# Patient Record
Sex: Male | Born: 1990 | Race: White | Hispanic: No | Marital: Single | Smoking: Former smoker
Health system: Southern US, Community
[De-identification: ages and names within clinical notes are randomized; demographics above are authoritative.]

## PROBLEM LIST (undated history)

## (undated) DIAGNOSIS — I499 Cardiac arrhythmia, unspecified: Secondary | ICD-10-CM

## (undated) DIAGNOSIS — F419 Anxiety disorder, unspecified: Secondary | ICD-10-CM

## (undated) HISTORY — DX: Cardiac arrhythmia, unspecified: I49.9

## (undated) HISTORY — DX: Anxiety disorder, unspecified: F41.9

## (undated) HISTORY — PX: OTHER SURGICAL HISTORY: SHX169

---
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
## 2013-06-11 ENCOUNTER — Emergency Department (HOSPITAL_COMMUNITY)
Admission: EM | Admit: 2013-06-11 | Discharge: 2013-06-12 | Disposition: A | Discharge status: Home / Self Care | Payer: Self-pay | Attending: Emergency Medicine | Admitting: Emergency Medicine

## 2013-06-11 ENCOUNTER — Encounter (HOSPITAL_COMMUNITY): Payer: Self-pay | Admitting: Emergency Medicine

## 2013-06-11 DIAGNOSIS — R55 Syncope and collapse: Secondary | ICD-10-CM | POA: Insufficient documentation

## 2013-06-11 DIAGNOSIS — R1012 Left upper quadrant pain: Secondary | ICD-10-CM | POA: Insufficient documentation

## 2013-06-11 DIAGNOSIS — F17200 Nicotine dependence, unspecified, uncomplicated: Secondary | ICD-10-CM | POA: Insufficient documentation

## 2013-06-11 DIAGNOSIS — R1011 Right upper quadrant pain: Secondary | ICD-10-CM | POA: Insufficient documentation

## 2013-06-11 DIAGNOSIS — R1013 Epigastric pain: Secondary | ICD-10-CM | POA: Insufficient documentation

## 2013-06-11 DIAGNOSIS — R300 Dysuria: Secondary | ICD-10-CM | POA: Insufficient documentation

## 2013-06-11 DIAGNOSIS — B349 Viral infection, unspecified: Secondary | ICD-10-CM

## 2013-06-11 DIAGNOSIS — B9789 Other viral agents as the cause of diseases classified elsewhere: Secondary | ICD-10-CM | POA: Insufficient documentation

## 2013-06-11 NOTE — ED Notes (Signed)
Pt c/o fever, body aches, chills, headache, emesis x 3 last 24 hours

## 2013-06-12 ENCOUNTER — Emergency Department (HOSPITAL_COMMUNITY): Payer: Medicaid Other

## 2013-06-12 LAB — URINALYSIS, ROUTINE W REFLEX MICROSCOPIC
Hgb urine dipstick: NEGATIVE
Protein, ur: NEGATIVE mg/dL
Urobilinogen, UA: 1 mg/dL (ref 0.0–1.0)

## 2013-06-12 MED ORDER — SODIUM CHLORIDE 0.9 % IV BOLUS (SEPSIS)
1000.0000 mL | Freq: Once | INTRAVENOUS | Status: AC
  Administered 2013-06-12: 1000 mL via INTRAVENOUS

## 2013-06-12 MED ORDER — ONDANSETRON HCL 4 MG PO TABS: 4.0000 mg | ORAL_TABLET | Freq: Four times a day (QID) | ORAL | Status: DC

## 2013-06-12 MED ORDER — ACETAMINOPHEN 325 MG PO TABS
650.0000 mg | ORAL_TABLET | Freq: Once | ORAL | Status: AC
  Administered 2013-06-12: 650 mg via ORAL
  Filled 2013-06-12: qty 2

## 2013-06-12 MED ORDER — ONDANSETRON HCL 4 MG/2ML IJ SOLN
4.0000 mg | Freq: Once | INTRAMUSCULAR | Status: AC
  Administered 2013-06-12: 4 mg via INTRAVENOUS
  Filled 2013-06-12: qty 2

## 2013-06-12 NOTE — ED Notes (Signed)
Pt states he has taken advil for his symptoms.  Pt states he took 3 advil yesterday and then he took another 3 advil today around 1300 hrs.

## 2013-06-12 NOTE — ED Provider Notes (Signed)
CSN: [id number]     Arrival date & time 06/11/13  2345 History   First MD Initiated Contact with Patient 06/12/13 0005     Chief Complaint  Patient presents with  . Fever  . Emesis   (Consider location/radiation/quality/duration/timing/severity/associated sxs/prior Treatment) Patient is a 22 y.o. male presenting with fever and syncope. The history is provided by the patient. No language interpreter was used.  Fever Max temp prior to arrival:  103.1 Temp source:  Oral Severity:  Moderate Onset quality:  Gradual Duration:  2 days Timing:  Constant Progression:  Unchanged Chronicity:  New Relieved by:  Nothing Worsened by:  Nothing tried Ineffective treatments:  None tried Associated symptoms: chills, dysuria, headaches, myalgias, nausea and vomiting   Associated symptoms: no chest pain, no confusion, no congestion, no cough, no diarrhea, no ear pain, no rash, no rhinorrhea and no sore throat   Dysuria:    Severity:  Mild   Onset quality:  Gradual   Duration:  2 days   Timing:  Intermittent   Progression:  Waxing and waning   Chronicity:  New Headaches:    Severity:  Mild   Onset quality:  Gradual   Duration:  1 day   Timing:  Intermittent   Progression:  Partially resolved   Chronicity:  New Risk factors: no hx of cancer, no immunosuppression, no occupational exposure, no recent surgery, no recent travel and no sick contacts   Loss of Consciousness Episode history:  Single Most recent episode:  Yesterday Timing:  Rare Progression:  Resolved Chronicity:  New Context: standing up (showering)   Witnessed: no   Relieved by:  Lying down Worsened by:  Nothing tried Associated symptoms: fever, headaches, nausea and vomiting   Associated symptoms: no chest pain, no confusion, no dizziness, no shortness of breath and no weakness   Associated symptoms comment:  Anorexia Risk factors: no congenital heart disease, no coronary artery disease, no seizures and no vascular disease      History reviewed. No pertinent past medical history. History reviewed. No pertinent past surgical history. No family history on file. History  Substance Use Topics  . Smoking status: Current Every Day Smoker  . Smokeless tobacco: Not on file  . Alcohol Use: No    Review of Systems  Constitutional: Positive for fever and chills. Negative for activity change, appetite change and fatigue.  HENT: Negative for ear pain, congestion, sore throat, facial swelling, rhinorrhea and trouble swallowing.   Eyes: Negative for photophobia and pain.  Respiratory: Negative for cough, chest tightness and shortness of breath.   Cardiovascular: Positive for syncope. Negative for chest pain and leg swelling.  Gastrointestinal: Positive for nausea and vomiting. Negative for abdominal pain, diarrhea and constipation.  Endocrine: Negative for polydipsia and polyuria.  Genitourinary: Positive for dysuria. Negative for urgency, decreased urine volume and difficulty urinating.  Musculoskeletal: Positive for myalgias. Negative for back pain and gait problem.  Skin: Negative for color change, rash and wound.  Allergic/Immunologic: Negative for immunocompromised state.  Neurological: Positive for headaches. Negative for dizziness, facial asymmetry, speech difficulty, weakness and numbness.  Psychiatric/Behavioral: Negative for confusion, decreased concentration and agitation.    Allergies  Review of patient's allergies indicates no known allergies.  Home Medications   Current Outpatient Rx  Name  Route  Sig  Dispense  Refill  . acetaminophen (TYLENOL) 500 MG tablet   Oral   Take 1,000 mg by mouth every 6 (six) hours as needed for pain.         Marland Kitchen  ondansetron (ZOFRAN) 4 MG tablet   Oral   Take 1 tablet (4 mg total) by mouth every 6 (six) hours.   15 tablet   0    BP 101/66  Pulse 135  Temp(Src) 99.9 F (37.7 C) (Oral)  Resp 20  Ht 6\' 1"  (1.854 m)  Wt 230 lb (104.327 kg)  BMI 30.35 kg/m2   SpO2 100% Physical Exam  Constitutional: He is oriented to person, place, and time. He appears well-developed and well-nourished. No distress.  HENT:  Head: Normocephalic and atraumatic.  Mouth/Throat: No oropharyngeal exudate.  Eyes: Pupils are equal, round, and reactive to light.  Neck: Normal range of motion. Neck supple.  Cardiovascular: Regular rhythm and normal heart sounds.  Tachycardia present.  Exam reveals no gallop and no friction rub.   No murmur heard. Pulmonary/Chest: Effort normal and breath sounds normal. No respiratory distress. He has no wheezes. He has no rales.  Abdominal: Soft. Bowel sounds are normal. He exhibits no distension and no mass. There is tenderness (mild) in the right upper quadrant, epigastric area and left upper quadrant. There is no rigidity, no rebound and no guarding.  Musculoskeletal: Normal range of motion. He exhibits no edema and no tenderness.  Neurological: He is alert and oriented to person, place, and time.  Skin: Skin is warm and dry.  Psychiatric: He has a normal mood and affect.    ED Course  Procedures (including critical care time) Labs Review Labs Reviewed  URINALYSIS, ROUTINE W REFLEX MICROSCOPIC - Abnormal; Notable for the following:    Color, Urine AMBER (*)    Specific Gravity, Urine 1.031 (*)    Bilirubin Urine SMALL (*)    All other components within normal limits   Imaging Review Dg Chest 2 View  06/12/2013   *RADIOLOGY REPORT*  Clinical Data: Fever, body aches, chills, headache, and emesis.  CHEST - 2 VIEW  Comparison: None.  Findings: The heart size and pulmonary vascularity are normal. The lungs appear clear and expanded without focal air space disease or consolidation. No blunting of the costophrenic angles.  No pneumothorax.  Mediastinal contours appear intact.  IMPRESSION: No evidence of active pulmonary disease.   Original Report Authenticated By: Burman Nieves, M.D.     Date: 06/12/2013  Rate: 100  Rhythm: sinus  tachycardia  QRS Axis: normal  Intervals: normal  ST/T Wave abnormalities: nonspecific T wave changes, TWI III, aVF, V1, V3, TW flattening V4-V6  Conduction Disutrbances:none  Narrative Interpretation:   Old EKG Reviewed: none available    MDM   1. Acute viral syndrome    Pt is a 22 y.o. male with Pmhx as above who presents with 2 days of fever, chills, myalgias, n/v, and one episode of syncope yesterday proceeded by lightheadedness & visual change.  On exam, pt is tachycardic, febrile, but non-toxic appearing.  Lungs clear, mild upper abdominal ttp w/o rebound or guarding.  No neck stiffness or rashes. EKG as above, CXR unremarkable. Symptoms improved after 1L NS, zofran, tylenol.  I believe he has acute viral syndrome and is safe for d/c with supportive care (antipyretics, PO hydration).  Return precautions given for new or worsening symptoms including CP, SOB, inability to tolerate PO.    1. Acute viral syndrome         Shanna Cisco, MD 06/12/13 [phone]

## 2015-10-08 DIAGNOSIS — I499 Cardiac arrhythmia, unspecified: Secondary | ICD-10-CM

## 2015-10-08 DIAGNOSIS — F419 Anxiety disorder, unspecified: Secondary | ICD-10-CM

## 2015-10-08 HISTORY — DX: Cardiac arrhythmia, unspecified: I49.9

## 2015-10-08 HISTORY — DX: Anxiety disorder, unspecified: F41.9

## 2016-02-05 ENCOUNTER — Encounter (HOSPITAL_COMMUNITY): Payer: Self-pay | Admitting: Emergency Medicine

## 2016-02-05 ENCOUNTER — Emergency Department (HOSPITAL_COMMUNITY)
Admission: EM | Admit: 2016-02-05 | Discharge: 2016-02-05 | Disposition: A | Discharge status: Home / Self Care | Payer: Medicaid Other | Attending: Emergency Medicine | Admitting: Emergency Medicine

## 2016-02-05 DIAGNOSIS — M546 Pain in thoracic spine: Secondary | ICD-10-CM | POA: Diagnosis present

## 2016-02-05 DIAGNOSIS — F17210 Nicotine dependence, cigarettes, uncomplicated: Secondary | ICD-10-CM | POA: Insufficient documentation

## 2016-02-05 MED ORDER — TRAMADOL HCL 50 MG PO TABS: 50.0000 mg | ORAL_TABLET | Freq: Two times a day (BID) | ORAL | Status: DC | PRN

## 2016-02-05 MED ORDER — IBUPROFEN 800 MG PO TABS: 800.0000 mg | ORAL_TABLET | Freq: Three times a day (TID) | ORAL | Status: DC

## 2016-02-05 MED ORDER — CYCLOBENZAPRINE HCL 10 MG PO TABS
10.0000 mg | ORAL_TABLET | Freq: Once | ORAL | Status: AC
  Administered 2016-02-05: 10 mg via ORAL
  Filled 2016-02-05: qty 1

## 2016-02-05 MED ORDER — CYCLOBENZAPRINE HCL 10 MG PO TABS: 10.0000 mg | ORAL_TABLET | Freq: Two times a day (BID) | ORAL | Status: DC | PRN

## 2016-02-05 MED ORDER — DEXAMETHASONE SODIUM PHOSPHATE 10 MG/ML IJ SOLN
10.0000 mg | Freq: Once | INTRAMUSCULAR | Status: AC
  Administered 2016-02-05: 10 mg via INTRAMUSCULAR
  Filled 2016-02-05: qty 1

## 2016-02-05 MED ORDER — KETOROLAC TROMETHAMINE 60 MG/2ML IM SOLN
60.0000 mg | Freq: Once | INTRAMUSCULAR | Status: AC
  Administered 2016-02-05: 60 mg via INTRAMUSCULAR
  Filled 2016-02-05: qty 2

## 2016-02-05 NOTE — Discharge Instructions (Signed)
Thoracic Strain A thoracic strain, which is sometimes called a mid-back strain, is an injury to the muscles or tendons that attach to the upper part of your back behind your chest. This type of injury occurs when a muscle is overstretched or overloaded.  Thoracic strains can range from mild to severe. Mild strains may involve stretching a muscle or tendon without tearing it. These injuries may heal in 1-2 weeks. More severe strains involve tearing of muscle fibers or tendons. These will cause more pain and may take 6-8 weeks to heal. CAUSES This condition may be caused by:  An injury in which a sudden force is placed on the muscle.  Exercising without properly warming up.  Overuse of the muscle.  Improper form during certain movements.  Other injuries that surround or cause stress on the mid-back, causing a strain on the muscles. In some cases, the cause may not be known. RISK FACTORS This injury is more common in:  Athletes.  People with obesity. SYMPTOMS The main symptom of this condition is pain, especially with movement. Other symptoms include:  Bruising.  Swelling.  Spasm. DIAGNOSIS This condition may be diagnosed with a physical exam. X-rays may be taken to check for a fracture. TREATMENT This condition may be treated with:  Resting and icing the injured area.  Physical therapy. This will involve doing stretching and strengthening exercises.  Medicines for pain and inflammation. HOME CARE INSTRUCTIONS  Rest as needed. Follow instructions from your health care provider about any restrictions on activity.  If directed, apply ice to the injured area:  Put ice in a plastic bag.  Place a towel between your skin and the bag.  Leave the ice on for 20 minutes, 2-3 times per day.  Take over-the-counter and prescription medicines only as told by your health care provider.  Begin doing exercises as told by your health care provider or physical therapist.  Always  warm up properly before physical activity or sports.  Bend your knees before you lift heavy objects.  Keep all follow-up visits as told by your health care provider. This is important. SEEK MEDICAL CARE IF:  Your pain is not helped by medicine.  Your pain, bruising, or swelling is getting worse.  You have a fever. SEEK IMMEDIATE MEDICAL CARE IF:  You have shortness of breath.  You have chest pain.  You develop numbness or weakness in your legs.  You have involuntary loss of urine (urinary incontinence).   This information is not intended to replace advice given to you by your health care provider. Make sure you discuss any questions you have with your health care provider.   Document Released: 12/14/2003 Document Revised: 06/14/2015 Document Reviewed: 11/17/2014 Elsevier Interactive Patient Education 2016 [employer] Inc[employer]  

## 2016-02-05 NOTE — ED Provider Notes (Signed)
CSN: [id number]     Arrival date & time 02/05/16  0033 History   First MD Initiated Contact with Patient 02/05/16 0056     Chief Complaint  Patient presents with  . Back Pain    right mid     (Consider location/radiation/quality/duration/timing/severity/associated sxs/prior Treatment) Patient is a 25 y.o. male presenting with back pain. The history is provided by the patient.  Back Pain Location:  Thoracic spine Radiates to:  Does not radiate Pain severity:  Severe Pain is:  Same all the time Onset quality:  Gradual Timing:  Constant Progression:  Worsening Chronicity:  New Context: physical stress   Context: not emotional stress, not falling, not jumping from heights, not lifting heavy objects, not MCA, not MVA, not occupational injury, not pedestrian accident, not recent illness, not recent injury and not twisting   Relieved by:  Nothing Worsened by:  Heat Ineffective treatments:  None tried Associated symptoms: no abdominal pain, no abdominal swelling, no bladder incontinence, no bowel incontinence, no chest pain, no dysuria, no fever, no headaches, no leg pain, no numbness, no paresthesias, no pelvic pain, no perianal numbness, no tingling, no weakness and no weight loss   Risk factors: no hx of cancer, not obese, no recent surgery, no steroid use and no vascular disease      Pt complains of new, 1 days of right mid to lower back pain, described as tight, sharp, non radiating, 8/10, worse with sitting straight up or bending over worse.  He applied heat, w/o relief, no other tx attempted.  No IVDU, no chronic steroids, no pmhx of back pain or injury, no numbness/tingling, no weakness, no urinary incontinence.   History reviewed. No pertinent past medical history. History reviewed. No pertinent past surgical history. History reviewed. No pertinent family history. Social History  Substance Use Topics  . Smoking status: Current Every Day Smoker -- 0.50 packs/day    Types:  Cigarettes  . Smokeless tobacco: None  . Alcohol Use: Yes     Comment: social    Review of Systems  Constitutional: Negative for fever and weight loss.  Cardiovascular: Negative for chest pain.  Gastrointestinal: Negative for abdominal pain and bowel incontinence.  Genitourinary: Negative for bladder incontinence, dysuria and pelvic pain.  Musculoskeletal: Positive for back pain.  Neurological: Negative for tingling, weakness, numbness, headaches and paresthesias.  All other systems reviewed and are negative.     Allergies  Review of patient's allergies indicates no known allergies.  Home Medications   Prior to Admission medications   Medication Sig Start Date End Date Taking? Authorizing Provider  acetaminophen (TYLENOL) 500 MG tablet Take 1,000 mg by mouth every 6 (six) hours as needed for pain.   Yes Historical Provider, MD  cyclobenzaprine (FLEXERIL) 10 MG tablet Take 1 tablet (10 mg total) by mouth 2 (two) times daily as needed for muscle spasms. 02/05/16   Danelle Berry, PA-C  ibuprofen (ADVIL,MOTRIN) 800 MG tablet Take 1 tablet (800 mg total) by mouth 3 (three) times daily. 02/05/16   Danelle Berry, PA-C  traMADol (ULTRAM) 50 MG tablet Take 1 tablet (50 mg total) by mouth every 12 (twelve) hours as needed for severe pain. 02/05/16   Danelle Berry, PA-C   BP 106/67 mmHg  Pulse 68  Temp(Src) 98.5 F (36.9 C) (Oral)  Resp 20  SpO2 99% Physical Exam  Constitutional: He is oriented to person, place, and time. He appears well-developed and well-nourished. No distress.  HENT:  Head: Normocephalic and atraumatic.  Right  Ear: External ear normal.  Left Ear: External ear normal.  Nose: Nose normal.  Mouth/Throat: Oropharynx is clear and moist. No oropharyngeal exudate.  Eyes: Conjunctivae and EOM are normal. Pupils are equal, round, and reactive to light. Right eye exhibits no discharge. Left eye exhibits no discharge. No scleral icterus.  Neck: Normal range of motion. Neck supple. No  JVD present. No tracheal deviation present.  Cardiovascular: Normal rate and regular rhythm.   Pulmonary/Chest: Effort normal and breath sounds normal. No stridor. No respiratory distress. He has no wheezes. He has no rales. He exhibits no tenderness.  Abdominal: Soft. Bowel sounds are normal.  Musculoskeletal: Normal range of motion. He exhibits no edema.  No midline tenderness along spinal processes from cervical to lumbar spine, mild thoracic right paraspinal tenderness, no spasm  Lymphadenopathy:    He has no cervical adenopathy.  Neurological: He is alert and oriented to person, place, and time. No cranial nerve deficit. He exhibits normal muscle tone. Coordination normal.  Normal sensation to light touch in all extremities 5/5 strength bilaterally with upper extremities grip strength, bilaterally with lower extremity dorsiflexion, plantarflexion, flexion and extension of bilateral knees  Skin: Skin is warm and dry. No rash noted. He is not diaphoretic. No erythema. No pallor.  Psychiatric: He has a normal mood and affect. His behavior is normal. Judgment and thought content normal.  Nursing note and vitals reviewed.   ED Course  Procedures (including critical care time) Labs Review Labs Reviewed - No data to display  Imaging Review No results found. I have personally reviewed and evaluated these images and lab results as part of my medical decision-making.   EKG Interpretation None      MDM   Patient with back pain s/p straining injury today with lifting gallons of paint.  No neurological deficits and normal neuro exam.  Patient can walk but states is painful.  No loss of bowel or bladder control.  No concern for cauda equina.  No fever, night sweats, weight loss, h/o cancer, IVDU.  RICE protocol and pain medicine indicated and discussed with patient.     Final diagnoses:  Right-sided thoracic back pain        Danelle BerryLeisa Harace Mccluney, PA-C 02/05/16 0200  Derwood KaplanAnkit Nanavati,  MD 02/05/16 [phone]

## 2016-02-05 NOTE — ED Notes (Signed)
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 Patient is ambulatory. Has not treated his pain PTA.

## 2016-02-19 ENCOUNTER — Emergency Department (HOSPITAL_COMMUNITY)
Admission: EM | Admit: 2016-02-19 | Discharge: 2016-02-19 | Disposition: A | Discharge status: Home / Self Care | Payer: Medicaid Other | Attending: Emergency Medicine | Admitting: Emergency Medicine

## 2016-02-19 ENCOUNTER — Encounter (HOSPITAL_COMMUNITY): Payer: Self-pay | Admitting: Emergency Medicine

## 2016-02-19 ENCOUNTER — Emergency Department (HOSPITAL_COMMUNITY): Payer: Medicaid Other

## 2016-02-19 DIAGNOSIS — Z79891 Long term (current) use of opiate analgesic: Secondary | ICD-10-CM | POA: Insufficient documentation

## 2016-02-19 DIAGNOSIS — Z791 Long term (current) use of non-steroidal anti-inflammatories (NSAID): Secondary | ICD-10-CM | POA: Insufficient documentation

## 2016-02-19 DIAGNOSIS — R0789 Other chest pain: Secondary | ICD-10-CM | POA: Insufficient documentation

## 2016-02-19 DIAGNOSIS — R079 Chest pain, unspecified: Secondary | ICD-10-CM | POA: Diagnosis present

## 2016-02-19 DIAGNOSIS — F17210 Nicotine dependence, cigarettes, uncomplicated: Secondary | ICD-10-CM | POA: Insufficient documentation

## 2016-02-19 DIAGNOSIS — Z79899 Other long term (current) drug therapy: Secondary | ICD-10-CM | POA: Diagnosis not present

## 2016-02-19 LAB — LIPASE, BLOOD: Lipase: 27 U/L (ref 11–51)

## 2016-02-19 LAB — CBC
HCT: 44.8 % (ref 39.0–52.0)
Hemoglobin: 15.5 g/dL (ref 13.0–17.0)
MCH: 30.6 pg (ref 26.0–34.0)
MCHC: 34.6 g/dL (ref 30.0–36.0)
MCV: 88.5 fL (ref 78.0–100.0)
PLATELETS: 225 10*3/uL (ref 150–400)
RBC: 5.06 MIL/uL (ref 4.22–5.81)
RDW: 12.5 % (ref 11.5–15.5)
WBC: 9.4 10*3/uL (ref 4.0–10.5)

## 2016-02-19 LAB — BASIC METABOLIC PANEL
Anion gap: 7 (ref 5–15)
BUN: 13 mg/dL (ref 6–20)
CHLORIDE: 105 mmol/L (ref 101–111)
CO2: 25 mmol/L (ref 22–32)
CREATININE: 0.81 mg/dL (ref 0.61–1.24)
Calcium: 8.9 mg/dL (ref 8.9–10.3)
GFR calc non Af Amer: >60 mL/min (ref >60)
GLUCOSE: 109 mg/dL — AB (ref 65–99)
Potassium: 3.8 mmol/L (ref 3.5–5.1)
Sodium: 137 mmol/L (ref 135–145)

## 2016-02-19 LAB — TROPONIN I: Troponin I: <0.03 ng/mL (ref <0.031)

## 2016-02-19 LAB — I-STAT TROPONIN, ED: TROPONIN I, POC: 0 ng/mL (ref 0.00–0.08)

## 2016-02-19 NOTE — Discharge Instructions (Signed)
There does not appear to be an emergent cause for your symptoms at this time. Your exam, labs, EKG and chest x-ray are all reassuring. Please follow-up with your doctor for reevaluation in the next 1-2 days. Stop smoking. Return to ED for new or worsening symptoms.  Nonspecific Chest Pain It is often hard to find the cause of chest pain. There is always a chance that your pain could be related to something serious, such as a heart attack or a blood clot in your lungs. Chest pain can also be caused by conditions that are not life-threatening. If you have chest pain, it is very important to follow up with your doctor.  HOME CARE  If you were prescribed an antibiotic medicine, finish it all even if you start to feel better.  Avoid any activities that cause chest pain.  Do not use any tobacco products, including cigarettes, chewing tobacco, or electronic cigarettes. If you need help quitting, ask your doctor.  Do not drink alcohol.  Take medicines only as told by your doctor.  Keep all follow-up visits as told by your doctor. This is important. This includes any further testing if your chest pain does not go away.  Your doctor may tell you to keep your head raised (elevated) while you sleep.  Make lifestyle changes as told by your doctor. These may include:  Getting regular exercise. Ask your doctor to suggest some activities that are safe for you.  Eating a heart-healthy diet. Your doctor or a diet specialist (dietitian) can help you to learn healthy eating options.  Maintaining a healthy weight.  Managing diabetes, if necessary.  Reducing stress. GET HELP IF:  Your chest pain does not go away, even after treatment.  You have a rash with blisters on your chest.  You have a fever. GET HELP RIGHT AWAY IF:  Your chest pain is worse.  You have an increasing cough, or you cough up blood.  You have severe belly (abdominal) pain.  You feel extremely weak.  You pass out  (faint).  You have chills.  You have sudden, unexplained chest discomfort.  You have sudden, unexplained discomfort in your arms, back, neck, or jaw.  You have shortness of breath at any time.  You suddenly start to sweat, or your skin gets clammy.  You feel nauseous.  You vomit.  You suddenly feel light-headed or dizzy.  Your heart begins to beat quickly, or it feels like it is skipping beats. These symptoms may be an emergency. Do not wait to see if the symptoms will go away. Get medical help right away. Call your local emergency services (911 in the [place] Do not drive yourself to the hospital.   This information is not intended to replace advice given to you by your health care provider. Make sure you discuss any questions you have with your health care provider.   Document Released: 03/11/2008 Document Revised: 10/14/2014 Document Reviewed: 04/29/2014 Elsevier Interactive Patient Education [employer]2016 [employer] Inc[employer]

## 2016-02-19 NOTE — ED Notes (Signed)
Patient complaining of generalized weakness, chest , epigastric pain, SOB off and on for two days. Patient stated his pain was worse tonight

## 2016-02-19 NOTE — ED Provider Notes (Signed)
CSN: [id number]     Arrival date & time 02/19/16  0425 History   First MD Initiated Contact with Patient 02/19/16 [phone]     Chief Complaint  Patient presents with  . Chest Pain  . Shortness of Breath     (Consider location/radiation/quality/duration/timing/severity/associated sxs/prior Treatment) HPI [name] is a 25 y.o. male is a current cigarette smoker comes in for evaluation of chest pain which was of breath. Patient reports he has had chest pain intermittently over the past 3 days that he characterizes as sharp in his left chest. Discomfort will resolve spontaneously and is nonexertional. He reports similar discomfort this morning at 4:00 AM, but also experienced shortness of breath. Denies any other fevers, chills, cough, nausea or vomiting, numbness or weakness, leg swelling. Patient reports that he is a half pack per day smoker, last had alcohol on Saturday-3 shots. No other cardiac risk factors. No other modifying factors.  History reviewed. No pertinent past medical history. History reviewed. No pertinent past surgical history. No family history on file. Social History  Substance Use Topics  . Smoking status: Current Every Day Smoker -- 0.50 packs/day    Types: Cigarettes  . Smokeless tobacco: Never Used  . Alcohol Use: Yes     Comment: social    Review of Systems A 10 point review of systems was completed and was negative except for pertinent positives and negatives as mentioned in the history of present illness     Allergies  Review of patient's allergies indicates no known allergies.  Home Medications   Prior to Admission medications   Medication Sig Start Date End Date Taking? Authorizing Provider  cyclobenzaprine (FLEXERIL) 10 MG tablet Take 1 tablet (10 mg total) by mouth 2 (two) times daily as needed for muscle spasms. Patient not taking: Reported on 02/19/2016 02/05/16   Danelle BerryLeisa Tapia, PA-C  ibuprofen (ADVIL,MOTRIN) 800 MG tablet Take 1 tablet (800 mg total) by  mouth 3 (three) times daily. Patient not taking: Reported on 02/19/2016 02/05/16   Danelle BerryLeisa Tapia, PA-C  traMADol (ULTRAM) 50 MG tablet Take 1 tablet (50 mg total) by mouth every 12 (twelve) hours as needed for severe pain. Patient not taking: Reported on 02/19/2016 02/05/16   Danelle BerryLeisa Tapia, PA-C   BP 114/61 mmHg  Pulse 90  Temp(Src) 98.4 F (36.9 C) (Oral)  Resp 16  SpO2 100% Physical Exam  Constitutional: He is oriented to person, place, and time. He appears well-developed and well-nourished.  HENT:  Head: Normocephalic and atraumatic.  Mouth/Throat: Oropharynx is clear and moist.  Eyes: Conjunctivae are normal. Pupils are equal, round, and reactive to light. Right eye exhibits no discharge. Left eye exhibits no discharge. No scleral icterus.  Neck: Neck supple.  Cardiovascular: Normal rate, regular rhythm and normal heart sounds.   Pulmonary/Chest: Effort normal and breath sounds normal. No respiratory distress. He has no wheezes. He has no rales.  Abdominal: Soft. There is no tenderness.  Musculoskeletal: He exhibits no edema or tenderness.  Neurological: He is alert and oriented to person, place, and time.  Cranial Nerves II-XII grossly intact  Skin: Skin is warm and dry. No rash noted.  Psychiatric: He has a normal mood and affect.  Nursing note and vitals reviewed.   ED Course  Procedures (including critical care time) Labs Review Labs Reviewed  BASIC METABOLIC PANEL - Abnormal; Notable for the following:    Glucose, Bld 109 (*)    All other components within normal limits  CBC  TROPONIN I  LIPASE, BLOOD  I-STAT TROPOININ, ED    Imaging Review Dg Chest 2 View  02/19/2016  CLINICAL DATA:  Intermittent left-sided chest pain for 3 days, worse tonight. Shortness of breath. Smoker. EXAM: CHEST  2 VIEW COMPARISON:  06/12/2013 FINDINGS: The heart size and mediastinal contours are within normal limits. Both lungs are clear. The visualized skeletal structures are unremarkable.  IMPRESSION: No active cardiopulmonary disease. Electronically Signed   By: Burman Nieves M.D.   On: 02/19/2016 04:50   I have personally reviewed and evaluated these images and lab results as part of my medical decision-making.   EKG Interpretation   Date/Time:  Monday Feb 19 2016 04:33:47 EDT Ventricular Rate:  80 PR Interval:  109 QRS Duration: 80 QT Interval:  367 QTC Calculation: 423 R Axis:   52 Text Interpretation:  Sinus rhythm Short PR interval Borderline  repolarization abnormality since last tracing no significant change  Confirmed by Hyacinth Meeker  MD, BRIAN ([id number]) on 02/19/2016 4:46:42 AM      MDM  Austin Zuniga is a 25 y.o. male is a half pack per day smoker, no other cardiac risk factors. Complains of sharp stabbing chest discomfort at 4:00 this morning, lasting 10 minutes, resolving on its own. No other associated symptoms. Symptoms are atypical for ACS. Heart score 2. PERC negative. Troponin negative, EKG is reassuring, labs otherwise unremarkable. Doubt other emergent pathology that requires immediate intervention at this time. Discussed smoking cessation. Follow-up with PCP. Final diagnoses:  Chest discomfort        Joycie Peek, PA-C 02/19/16 1914  Leta Baptist, MD 02/22/16 [phone]

## 2016-02-26 ENCOUNTER — Encounter (HOSPITAL_COMMUNITY): Payer: Self-pay | Admitting: *Deleted

## 2016-02-26 ENCOUNTER — Emergency Department (HOSPITAL_COMMUNITY): Payer: Medicaid Other

## 2016-02-26 ENCOUNTER — Emergency Department (HOSPITAL_COMMUNITY)
Admission: EM | Admit: 2016-02-26 | Discharge: 2016-02-26 | Disposition: A | Discharge status: Home / Self Care | Payer: Medicaid Other | Attending: Emergency Medicine | Admitting: Emergency Medicine

## 2016-02-26 ENCOUNTER — Encounter (HOSPITAL_COMMUNITY): Payer: Self-pay | Admitting: Emergency Medicine

## 2016-02-26 DIAGNOSIS — R079 Chest pain, unspecified: Secondary | ICD-10-CM | POA: Insufficient documentation

## 2016-02-26 DIAGNOSIS — F17210 Nicotine dependence, cigarettes, uncomplicated: Secondary | ICD-10-CM

## 2016-02-26 DIAGNOSIS — R002 Palpitations: Secondary | ICD-10-CM | POA: Diagnosis present

## 2016-02-26 DIAGNOSIS — R000 Tachycardia, unspecified: Secondary | ICD-10-CM | POA: Diagnosis not present

## 2016-02-26 DIAGNOSIS — R110 Nausea: Secondary | ICD-10-CM | POA: Diagnosis not present

## 2016-02-26 DIAGNOSIS — R0602 Shortness of breath: Secondary | ICD-10-CM | POA: Diagnosis not present

## 2016-02-26 LAB — I-STAT CHEM 8, ED
BUN: 12 mg/dL (ref 6–20)
CALCIUM ION: 1.18 mmol/L (ref 1.12–1.23)
CHLORIDE: 105 mmol/L (ref 101–111)
CREATININE: 0.8 mg/dL (ref 0.61–1.24)
GLUCOSE: 99 mg/dL (ref 65–99)
HCT: 50 % (ref 39.0–52.0)
Hemoglobin: 17 g/dL (ref 13.0–17.0)
POTASSIUM: 3.6 mmol/L (ref 3.5–5.1)
Sodium: 143 mmol/L (ref 135–145)
TCO2: 24 mmol/L (ref 0–100)

## 2016-02-26 LAB — I-STAT CG4 LACTIC ACID, ED: Lactic Acid, Venous: 0.92 mmol/L (ref 0.5–2.0)

## 2016-02-26 LAB — D-DIMER, QUANTITATIVE (NOT AT ARMC): D DIMER QUANT: 0.47 ug{FEU}/mL (ref 0.00–0.50)

## 2016-02-26 LAB — I-STAT TROPONIN, ED: Troponin i, poc: 0.02 ng/mL (ref 0.00–0.08)

## 2016-02-26 LAB — TSH: TSH: 0.785 u[IU]/mL (ref 0.350–4.500)

## 2016-02-26 NOTE — ED Provider Notes (Signed)
CSN: [id number]     Arrival date & time 02/26/16  0445 History   First MD Initiated Contact with Patient 02/26/16 0501     Chief Complaint  Patient presents with  . Tachycardia     (Consider location/radiation/quality/duration/timing/severity/associated sxs/prior Treatment) HPI  This is a 25 year old male who presents with palpitations. Patient states that he woke up his morning and felt like his heart was racing. He felt nauseous and short of breath. Symptoms have since resolved. He has had 3 distinct episodes of similar symptoms in the past. He was recently evaluated for the same and had a reassuring workup. Denies any increased caffeine use, cocaine use, stimulant use. Denies any history of blood clots lower extremity swelling or risk factors for blood clots. Denies early family history of cardiac death. Does report a family history of heart disease.  History reviewed. No pertinent past medical history. History reviewed. No pertinent past surgical history. No family history on file. Social History  Substance Use Topics  . Smoking status: Current Every Day Smoker -- 0.50 packs/day    Types: Cigarettes  . Smokeless tobacco: Never Used  . Alcohol Use: Yes     Comment: social    Review of Systems  Constitutional: Negative.  Negative for fever.  Respiratory: Positive for chest tightness and shortness of breath. Negative for cough.   Cardiovascular: Positive for chest pain and palpitations. Negative for leg swelling.  Gastrointestinal: Positive for nausea. Negative for abdominal pain.  Genitourinary: Negative.   All other systems reviewed and are negative.     Allergies  Review of patient's allergies indicates no known allergies.  Home Medications   Prior to Admission medications   Medication Sig Start Date End Date Taking? Authorizing Provider  cyclobenzaprine (FLEXERIL) 10 MG tablet Take 1 tablet (10 mg total) by mouth 2 (two) times daily as needed for muscle  spasms. Patient not taking: Reported on 02/19/2016 02/05/16   Danelle BerryLeisa Tapia, PA-C  ibuprofen (ADVIL,MOTRIN) 800 MG tablet Take 1 tablet (800 mg total) by mouth 3 (three) times daily. Patient not taking: Reported on 02/19/2016 02/05/16   Danelle BerryLeisa Tapia, PA-C  traMADol (ULTRAM) 50 MG tablet Take 1 tablet (50 mg total) by mouth every 12 (twelve) hours as needed for severe pain. Patient not taking: Reported on 02/19/2016 02/05/16   Danelle BerryLeisa Tapia, PA-C   BP 118/77 mmHg  Pulse 88  Resp 18  Ht 6\' 1"  (1.854 m)  Wt 235 lb (106.595 kg)  BMI 31.01 kg/m2  SpO2 100% Physical Exam  Constitutional: He is oriented to person, place, and time. He appears well-developed and well-nourished. No distress.  HENT:  Head: Normocephalic and atraumatic.  Eyes: Pupils are equal, round, and reactive to light.  Neck: Neck supple.  Cardiovascular: Normal rate, regular rhythm and normal heart sounds.   No murmur heard. Pulmonary/Chest: Effort normal and breath sounds normal. No respiratory distress. He has no wheezes.  Abdominal: Soft. Bowel sounds are normal. There is no tenderness. There is no rebound.  Musculoskeletal: He exhibits no edema.  Lymphadenopathy:    He has no cervical adenopathy.  Neurological: He is alert and oriented to person, place, and time.  Skin: Skin is warm and dry.  Psychiatric: He has a normal mood and affect.  Nursing note and vitals reviewed.   ED Course  Procedures (including critical care time) Labs Review Labs Reviewed  D-DIMER, QUANTITATIVE (NOT AT [facility])  TSH  I-STAT TROPOININ, ED  I-STAT CHEM 8, ED  I-STAT CG4 LACTIC ACID, ED  Imaging Review Dg Chest 2 View  02/26/2016  CLINICAL DATA:  Chest pain.  Nausea and tachycardia. EXAM: CHEST  2 VIEW COMPARISON:  02/19/2016 FINDINGS: The cardiomediastinal contours are normal. The lungs are clear. Pulmonary vasculature is normal. No consolidation, pleural effusion, or pneumothorax. No acute osseous abnormalities are seen. IMPRESSION: No acute  pulmonary process. Electronically Signed   By: Rubye Oaks M.D.   On: 02/26/2016 06:22   I have personally reviewed and evaluated these images and lab results as part of my medical decision-making.   EKG Interpretation   Date/Time:  Monday Feb 26 2016 05:35:48 EDT Ventricular Rate:  83 PR Interval:  155 QRS Duration: 88 QT Interval:  359 QTC Calculation: 422 R Axis:   68 Text Interpretation:  Sinus rhythm Borderline T abnormalities, inferior  leads No significant change since last tracing Confirmed by Chirstopher Iovino  MD,  Toni Amend ([id number]) on 02/26/2016 5:46:31 AM Also confirmed by Wilkie Aye  MD,  Ananda Sitzer ([id number]), editor [place] [facility], Jola Babinski [phone])  on 02/26/2016 6:54:10  AM      MDM   Final diagnoses:  Palpitations    Patient present with palpitations and shortness of breath. Symptoms have since resolved. He is not tachycardic. Vital signs are reassuring. Lab work obtained including d-dimer. Lab work is largely reassuring. No metabolic derangement. Troponin negative. D-dimer negative. Thyroid studies pending. Patient has remained hemodynamically stable and asymptomatic in the emergency room. Will discharge home. Encouraged cardiology follow-up for possible Holter monitoring. Advised patient to avoid caffeine or other stimulants.  After history, exam, and medical workup I feel the patient has been appropriately medically screened and is safe for discharge home. Pertinent diagnoses were discussed with the patient. Patient was given return precautions.     Shon Baton, MD 02/26/16 [phone]

## 2016-02-26 NOTE — Discharge Instructions (Signed)

## 2016-02-26 NOTE — ED Notes (Signed)
The pt is here with chest pain in as many days.  He was seen here earlier today for the same he had blood drawn and xray today.   He was referred to a cardiologist but they cannot see him until thursday

## 2016-02-26 NOTE — ED Notes (Signed)
Patient arrived to ED via [facility]. Patient reports he awakened approx 0400 - "felt fine." Then he felt that his heart was "racing" and felt nauseous. Stepped outside. Nausea resolved. Reports this is his 3rd episode of tachycardia. BP 144/84, Pulse 112, Resp 12, 97% on room air.

## 2016-02-27 ENCOUNTER — Emergency Department (HOSPITAL_COMMUNITY)
Admission: EM | Admit: 2016-02-27 | Discharge: 2016-02-27 | Disposition: A | Discharge status: Home / Self Care | Payer: Medicaid Other | Source: Home / Self Care | Attending: Emergency Medicine | Admitting: Emergency Medicine

## 2016-02-27 ENCOUNTER — Telehealth: Payer: Self-pay | Admitting: Cardiology

## 2016-02-27 DIAGNOSIS — R079 Chest pain, unspecified: Secondary | ICD-10-CM

## 2016-02-27 MED ORDER — LORAZEPAM 1 MG PO TABS
1.0000 mg | ORAL_TABLET | Freq: Once | ORAL | Status: AC
  Administered 2016-02-27: 1 mg via ORAL
  Filled 2016-02-27: qty 1

## 2016-02-27 MED ORDER — LORAZEPAM 1 MG PO TABS: 1.0000 mg | ORAL_TABLET | Freq: Three times a day (TID) | ORAL | Status: DC | PRN

## 2016-02-27 NOTE — ED Provider Notes (Signed)
CSN: [id number]     Arrival date & time 02/26/16  2204 History   First MD Initiated Contact with Patient 02/27/16 0502     Chief Complaint  Patient presents with  . Chest Pain     (Consider location/radiation/quality/duration/timing/severity/associated sxs/prior Treatment) HPI Comments: Patient returns to ED for further evaluation of persistent chest pain in left chest. He was seen 5/15 and 5/22 for same complaint. He reports his tests were negative and he was referred to a cardiologist for possible monitoring. Tonight the chest pain has been worsening. He states that it is worse when is at rest. No nausea or vomiting. No fever or cough.   Patient is a 25 y.o. male presenting with chest pain. The history is provided by the patient. No language interpreter was used.  Chest Pain Pain location:  L chest Pain radiates to the back: no   Associated symptoms: no cough, no fever, no nausea, no shortness of breath and not vomiting     History reviewed. No pertinent past medical history. History reviewed. No pertinent past surgical history. No family history on file. Social History  Substance Use Topics  . Smoking status: Current Every Day Smoker -- 0.50 packs/day    Types: Cigarettes  . Smokeless tobacco: Never Used  . Alcohol Use: Yes     Comment: social    Review of Systems  Constitutional: Negative for fever and chills.  HENT: Negative.   Respiratory: Negative.  Negative for cough and shortness of breath.   Cardiovascular: Positive for chest pain.  Gastrointestinal: Negative.  Negative for nausea and vomiting.  Musculoskeletal: Negative.   Skin: Negative.   Neurological: Negative.       Allergies  Review of patient's allergies indicates no known allergies.  Home Medications   Prior to Admission medications   Medication Sig Start Date End Date Taking? Authorizing Provider  cyclobenzaprine (FLEXERIL) 10 MG tablet Take 1 tablet (10 mg total) by mouth 2 (two) times daily as  needed for muscle spasms. Patient not taking: Reported on 02/19/2016 02/05/16   Danelle Berry, PA-C  ibuprofen (ADVIL,MOTRIN) 800 MG tablet Take 1 tablet (800 mg total) by mouth 3 (three) times daily. Patient not taking: Reported on 02/19/2016 02/05/16   Danelle Berry, PA-C  traMADol (ULTRAM) 50 MG tablet Take 1 tablet (50 mg total) by mouth every 12 (twelve) hours as needed for severe pain. Patient not taking: Reported on 02/19/2016 02/05/16   Danelle Berry, PA-C   BP 122/85 mmHg  Pulse 87  Temp(Src) 98.2 F (36.8 C) (Oral)  Resp 22  Wt 106.595 kg  SpO2 100% Physical Exam  Constitutional: He is oriented to person, place, and time. He appears well-developed and well-nourished.  HENT:  Head: Normocephalic.  Neck: Normal range of motion. Neck supple.  Cardiovascular: Normal rate and regular rhythm.   Pulmonary/Chest: Effort normal and breath sounds normal. He has no wheezes. He has no rales. He exhibits tenderness (Left chest wall tenderness. ).  Abdominal: Soft. Bowel sounds are normal. There is no tenderness. There is no rebound and no guarding.  Musculoskeletal: Normal range of motion.  Neurological: He is alert and oriented to person, place, and time.  Skin: Skin is warm and dry. No rash noted.  Psychiatric: He has a normal mood and affect.    ED Course  Procedures (including critical care time) Labs Review Labs Reviewed - No data to display  Imaging Review Dg Chest 2 View  02/26/2016  CLINICAL DATA:  Chest pain.  Nausea  and tachycardia. EXAM: CHEST  2 VIEW COMPARISON:  02/19/2016 FINDINGS: The cardiomediastinal contours are normal. The lungs are clear. Pulmonary vasculature is normal. No consolidation, pleural effusion, or pneumothorax. No acute osseous abnormalities are seen. IMPRESSION: No acute pulmonary process. Electronically Signed   By: Rubye OaksMelanie  Ehinger M.D.   On: 02/26/2016 06:22   I have personally reviewed and evaluated these images and lab results as part of my medical  decision-making.   EKG Interpretation   Date/Time:  Monday Feb 26 2016 22:14:52 EDT Ventricular Rate:  79 PR Interval:  142 QRS Duration: 84 QT Interval:  380 QTC Calculation: 435 R Axis:   59 Text Interpretation:  Normal sinus rhythm Normal ECG Confirmed by HORTON   MD, COURTNEY ([id number]) on 02/27/2016 4:46:09 AM      MDM   Final diagnoses:  None    1. Persistent chest pain  The chart is reviewed. He has had 2 negative CXRs; negative d-dimer, negative troponins. He reports constant chest pain that is worse with rest and "being quiet". Suspect axiety component to chest discomfort. Will provide Ativan to help with symptoms and reassess.   6:00 - the patient feels better with Ativan. He appears more comfortable. VSS. Feel he can be discharged home with follow up as planned with cardiology.   Elpidio AnisShari Perle Gibbon, PA-C 02/27/16 [phone]  Shon Batonourtney F Horton, MD 02/27/16 2253

## 2016-02-27 NOTE — Discharge Instructions (Signed)
Generalized Anxiety Disorder °Generalized anxiety disorder (GAD) is a mental disorder. It interferes with life functions, including relationships, work, and school. °GAD is different from normal anxiety, which everyone experiences at some point in their lives in response to specific life events and activities. Normal anxiety actually helps us prepare for and get through these life events and activities. Normal anxiety goes away after the event or activity is over.  °GAD causes anxiety that is not necessarily related to specific events or activities. It also causes excess anxiety in proportion to specific events or activities. The anxiety associated with GAD is also difficult to control. GAD can vary from mild to severe. People with severe GAD can have intense waves of anxiety with physical symptoms (panic attacks).  °SYMPTOMS °The anxiety and worry associated with GAD are difficult to control. This anxiety and worry are related to many life events and activities and also occur more days than not for 6 months or longer. People with GAD also have three or more of the following symptoms (one or more in children): °· Restlessness.   °· Fatigue. °· Difficulty concentrating.   °· Irritability. °· Muscle tension. °· Difficulty sleeping or unsatisfying sleep. °DIAGNOSIS °GAD is diagnosed through an assessment by your health care provider. Your health care provider will ask you questions about your mood, physical symptoms, and events in your life. Your health care provider may ask you about your medical history and use of alcohol or drugs, including prescription medicines. Your health care provider may also do a physical exam and blood tests. Certain medical conditions and the use of certain substances can cause symptoms similar to those associated with GAD. Your health care provider may refer you to a mental health specialist for further evaluation. °TREATMENT °The following therapies are usually used to treat GAD:   °· Medication. Antidepressant medication usually is prescribed for long-term daily control. Antianxiety medicines may be added in severe cases, especially when panic attacks occur.   °· Talk therapy (psychotherapy). Certain types of talk therapy can be helpful in treating GAD by providing support, education, and guidance. A form of talk therapy called cognitive behavioral therapy can teach you healthy ways to think about and react to daily life events and activities. °· Stress management techniques. These include yoga, meditation, and exercise and can be very helpful when they are practiced regularly. °A mental health specialist can help determine which treatment is best for you. Some people see improvement with one therapy. However, other people require a combination of therapies. °  °This information is not intended to replace advice given to you by your health care provider. Make sure you discuss any questions you have with your health care provider. °  °Document Released: 01/18/2013 Document Revised: 10/14/2014 Document Reviewed: 01/18/2013 °Elsevier Interactive Patient Education ©2016 [employer] Inc[employer] °Nonspecific Chest Pain  °Chest pain can be caused by many different conditions. There is always a chance that your pain could be related to something serious, such as a heart attack or a blood clot in your lungs. Chest pain can also be caused by conditions that are not life-threatening. If you have chest pain, it is very important to follow up with your health care provider. °CAUSES  °Chest pain can be caused by: °· Heartburn. °· Pneumonia or bronchitis. °· Anxiety or stress. °· Inflammation around your heart (pericarditis) or lung (pleuritis or pleurisy). °· A blood clot in your lung. °· A collapsed lung (pneumothorax). It can develop suddenly on its own (spontaneous pneumothorax) or from trauma to the chest. °·   Shingles infection (varicella-zoster virus). °· Heart attack. °· Damage to the bones, muscles, and  cartilage that make up your chest wall. This can include: °· Bruised bones due to injury. °· Strained muscles or cartilage due to frequent or repeated coughing or overwork. °· Fracture to one or more ribs. °· Sore cartilage due to inflammation (costochondritis). °RISK FACTORS  °Risk factors for chest pain may include: °· Activities that increase your risk for trauma or injury to your chest. °· Respiratory infections or conditions that cause frequent coughing. °· Medical conditions or overeating that can cause heartburn. °· Heart disease or family history of heart disease. °· Conditions or health behaviors that increase your risk of developing a blood clot. °· Having had chicken pox (varicella zoster). °SIGNS AND SYMPTOMS °Chest pain can feel like: °· Burning or tingling on the surface of your chest or deep in your chest. °· Crushing, pressure, aching, or squeezing pain. °· Dull or sharp pain that is worse when you move, cough, or take a deep breath. °· Pain that is also felt in your back, neck, shoulder, or arm, or pain that spreads to any of these areas. °Your chest pain may come and go, or it may stay constant. °DIAGNOSIS °Lab tests or other studies may be needed to find the cause of your pain. Your health care provider may have you take a test called an ambulatory ECG (electrocardiogram). An ECG records your heartbeat patterns at the time the test is performed. You may also have other tests, such as: °· Transthoracic echocardiogram (TTE). During echocardiography, sound waves are used to create a picture of all of the heart structures and to look at how blood flows through your heart. °· Transesophageal echocardiogram (TEE). This is a more advanced imaging test that obtains images from inside your body. It allows your health care provider to see your heart in finer detail. °· Cardiac monitoring. This allows your health care provider to monitor your heart rate and rhythm in real time. °· Holter monitor. This is a  portable device that records your heartbeat and can help to diagnose abnormal heartbeats. It allows your health care provider to track your heart activity for several days, if needed. °· Stress tests. These can be done through exercise or by taking medicine that makes your heart beat more quickly. °· Blood tests. °· Imaging tests. °TREATMENT  °Your treatment depends on what is causing your chest pain. Treatment may include: °· Medicines. These may include: °¨ Acid blockers for heartburn. °¨ Anti-inflammatory medicine. °¨ Pain medicine for inflammatory conditions. °¨ Antibiotic medicine, if an infection is present. °¨ Medicines to dissolve blood clots. °¨ Medicines to treat coronary artery disease. °· Supportive care for conditions that do not require medicines. This may include: °¨ Resting. °¨ Applying heat or cold packs to injured areas. °¨ Limiting activities until pain decreases. °HOME CARE INSTRUCTIONS °· If you were prescribed an antibiotic medicine, finish it all even if you start to feel better. °· Avoid any activities that bring on chest pain. °· Do not use any tobacco products, including cigarettes, chewing tobacco, or electronic cigarettes. If you need help quitting, ask your health care provider. °· Do not drink alcohol. °· Take medicines only as directed by your health care provider. °· Keep all follow-up visits as directed by your health care provider. This is important. This includes any further testing if your chest pain does not go away. °· If heartburn is the cause for your chest pain, you may be told   to keep your head raised (elevated) while sleeping. This reduces the chance that acid will go from your stomach into your esophagus. °· Make lifestyle changes as directed by your health care provider. These may include: °¨ Getting regular exercise. Ask your health care provider to suggest some activities that are safe for you. °¨ Eating a heart-healthy diet. A registered dietitian can help you to learn  healthy eating options. °¨ Maintaining a healthy weight. °¨ Managing diabetes, if necessary. °¨ Reducing stress. °SEEK MEDICAL CARE IF: °· Your chest pain does not go away after treatment. °· You have a rash with blisters on your chest. °· You have a fever. °SEEK IMMEDIATE MEDICAL CARE IF:  °· Your chest pain is worse. °· You have an increasing cough, or you cough up blood. °· You have severe abdominal pain. °· You have severe weakness. °· You faint. °· You have chills. °· You have sudden, unexplained chest discomfort. °· You have sudden, unexplained discomfort in your arms, back, neck, or jaw. °· You have shortness of breath at any time. °· You suddenly start to sweat, or your skin gets clammy. °· You feel nauseous or you vomit. °· You suddenly feel light-headed or dizzy. °· Your heart begins to beat quickly, or it feels like it is skipping beats. °These symptoms may represent a serious problem that is an emergency. Do not wait to see if the symptoms will go away. Get medical help right away. Call your local emergency services (911 in the [place] Do not drive yourself to the hospital. °  °This information is not intended to replace advice given to you by your health care provider. Make sure you discuss any questions you have with your health care provider. °  °Document Released: 07/03/2005 Document Revised: 10/14/2014 Document Reviewed: 04/29/2014 °Elsevier Interactive Patient Education ©2016 Elsevier Inc. ° °

## 2016-02-27 NOTE — ED Notes (Signed)
Pt suggesting CP is anxiety/stress related; CP worse at night; pt states "heart starts racing when I have thoughts racing through my head"

## 2016-02-27 NOTE — Telephone Encounter (Signed)
Called pt to get updated medical and family history, I was unable to reach the pt because the phone number is not accepting calls at this time.

## 2016-02-28 NOTE — Progress Notes (Signed)
     HPI: 25 year old male for evaluation of tachycardia and chest pain. Patient seen recently in the emergency room twice for chest pain. Felt to be musculoskeletal. Laboratories 02/26/2016 showed potassium 3.6, Normal TSH, d-dimer and troponin. Chest x-ray negative. Hemoglobin 15.5.  Current Outpatient Prescriptions  Medication Sig Dispense Refill  . cyclobenzaprine (FLEXERIL) 10 MG tablet Take 1 tablet (10 mg total) by mouth 2 (two) times daily as needed for muscle spasms. (Patient not taking: Reported on 02/19/2016) 20 tablet 0  . ibuprofen (ADVIL,MOTRIN) 800 MG tablet Take 1 tablet (800 mg total) by mouth 3 (three) times daily. (Patient not taking: Reported on 02/19/2016) 21 tablet 0  . LORazepam (ATIVAN) 1 MG tablet Take 1 tablet (1 mg total) by mouth 3 (three) times daily as needed for anxiety. 6 tablet 0  . traMADol (ULTRAM) 50 MG tablet Take 1 tablet (50 mg total) by mouth every 12 (twelve) hours as needed for severe pain. (Patient not taking: Reported on 02/19/2016) 6 tablet 0   No current facility-administered medications for this visit.    No Known Allergies  No past medical history on file.  No past surgical history on file.  Social History   Social History  . Marital Status: Single    Spouse Name: N/A  . Number of Children: N/A  . Years of Education: N/A   Occupational History  . Not on file.   Social History Main Topics  . Smoking status: Current Every Day Smoker -- 0.50 packs/day    Types: Cigarettes  . Smokeless tobacco: Never Used  . Alcohol Use: Yes     Comment: social  . Drug Use: No  . Sexual Activity: Not on file   Other Topics Concern  . Not on file   Social History Narrative    No family history on file.  ROS: no fevers or chills, productive cough, hemoptysis, dysphasia, odynophagia, melena, hematochezia, dysuria, hematuria, rash, seizure activity, orthopnea, PND, pedal edema, claudication. Remaining systems are negative.  Physical Exam:    There were no vitals taken for this visit.  General:  Well developed/well nourished in NAD Skin warm/dry Patient not depressed No peripheral clubbing Back-normal HEENT-normal/normal eyelids Neck supple/normal carotid upstroke bilaterally; no bruits; no JVD; no thyromegaly chest - CTA/ normal expansion CV - RRR/normal S1 and S2; no murmurs, rubs or gallops;  PMI nondisplaced Abdomen -NT/ND, no HSM, no mass, + bowel sounds, no bruit 2+ femoral pulses, no bruits Ext-no edema, chords, 2+ DP Neuro-grossly nonfocal  ECG  02/26/2016-sinus rhythm with no ST changes. Feb 19 2016 shows sinus with inferior lateral TWave changes.  This encounter was created in error - please disregard.

## 2016-02-29 ENCOUNTER — Encounter: Payer: Self-pay | Admitting: *Deleted

## 2016-02-29 ENCOUNTER — Encounter: Payer: Medicaid Other | Admitting: Cardiology

## 2016-03-09 ENCOUNTER — Encounter: Payer: Self-pay | Admitting: Student

## 2016-03-10 ENCOUNTER — Encounter (HOSPITAL_COMMUNITY): Payer: Self-pay

## 2016-03-10 ENCOUNTER — Emergency Department (HOSPITAL_COMMUNITY)
Admission: EM | Admit: 2016-03-10 | Discharge: 2016-03-10 | Disposition: A | Discharge status: Home / Self Care | Payer: Medicaid Other | Attending: Emergency Medicine | Admitting: Emergency Medicine

## 2016-03-10 DIAGNOSIS — F17210 Nicotine dependence, cigarettes, uncomplicated: Secondary | ICD-10-CM | POA: Insufficient documentation

## 2016-03-10 DIAGNOSIS — R51 Headache: Secondary | ICD-10-CM | POA: Diagnosis not present

## 2016-03-10 DIAGNOSIS — Z791 Long term (current) use of non-steroidal anti-inflammatories (NSAID): Secondary | ICD-10-CM | POA: Diagnosis not present

## 2016-03-10 DIAGNOSIS — T679XXA Effect of heat and light, unspecified, initial encounter: Secondary | ICD-10-CM

## 2016-03-10 DIAGNOSIS — T678XXA Other effects of heat and light, initial encounter: Secondary | ICD-10-CM | POA: Insufficient documentation

## 2016-03-10 DIAGNOSIS — Z79899 Other long term (current) drug therapy: Secondary | ICD-10-CM | POA: Diagnosis not present

## 2016-03-10 LAB — BASIC METABOLIC PANEL
ANION GAP: 9 (ref 5–15)
BUN: 12 mg/dL (ref 6–20)
CALCIUM: 9.3 mg/dL (ref 8.9–10.3)
CO2: 25 mmol/L (ref 22–32)
Chloride: 105 mmol/L (ref 101–111)
Creatinine, Ser: 0.87 mg/dL (ref 0.61–1.24)
GFR calc Af Amer: >60 mL/min (ref >60)
Glucose, Bld: 115 mg/dL — ABNORMAL HIGH (ref 65–99)
POTASSIUM: 3.4 mmol/L — AB (ref 3.5–5.1)
SODIUM: 139 mmol/L (ref 135–145)

## 2016-03-10 LAB — CBC WITH DIFFERENTIAL/PLATELET
BASOS ABS: 0 10*3/uL (ref 0.0–0.1)
Basophils Relative: 0 %
EOS ABS: 0.1 10*3/uL (ref 0.0–0.7)
EOS PCT: 1 %
HCT: 42.1 % (ref 39.0–52.0)
Hemoglobin: 14.9 g/dL (ref 13.0–17.0)
Lymphocytes Relative: 27 %
Lymphs Abs: 2.4 10*3/uL (ref 0.7–4.0)
MCH: 30.6 pg (ref 26.0–34.0)
MCHC: 35.4 g/dL (ref 30.0–36.0)
MCV: 86.4 fL (ref 78.0–100.0)
MONO ABS: 0.6 10*3/uL (ref 0.1–1.0)
Monocytes Relative: 7 %
Neutro Abs: 5.7 10*3/uL (ref 1.7–7.7)
Neutrophils Relative %: 65 %
PLATELETS: 229 10*3/uL (ref 150–400)
RBC: 4.87 MIL/uL (ref 4.22–5.81)
RDW: 12.5 % (ref 11.5–15.5)
WBC: 8.7 10*3/uL (ref 4.0–10.5)

## 2016-03-10 LAB — CK: CK TOTAL: 116 U/L (ref 49–397)

## 2016-03-10 MED ORDER — SODIUM CHLORIDE 0.9 % IV BOLUS (SEPSIS)
1000.0000 mL | Freq: Once | INTRAVENOUS | Status: AC
  Administered 2016-03-10: 1000 mL via INTRAVENOUS

## 2016-03-10 MED ORDER — KETOROLAC TROMETHAMINE 30 MG/ML IJ SOLN
30.0000 mg | Freq: Once | INTRAMUSCULAR | Status: AC
  Administered 2016-03-10: 30 mg via INTRAVENOUS
  Filled 2016-03-10: qty 1

## 2016-03-10 MED ORDER — SODIUM CHLORIDE 0.9 % IV SOLN
Freq: Once | INTRAVENOUS | Status: AC
  Administered 2016-03-10: 06:00:00 via INTRAVENOUS

## 2016-03-10 NOTE — ED Notes (Signed)
Bed: [id number] Expected date:  Expected time:  Means of arrival:  Comments: ES

## 2016-03-10 NOTE — ED Notes (Signed)
Pt. Has been working in the sun all day. Has a headache and feels lethargic. Has tried drinking gatorade but isn't feeling better.

## 2016-03-10 NOTE — Discharge Instructions (Signed)
Heat Exhaustion Information WHAT IS HEAT EXHAUSTION? Heat exhaustion happens when your body gets overheated from hot weather or from exercise. Heat exhaustion makes the temperature of your skin and body go up. Your body cools itself by sweating. If you do not drink enough water to replace what you sweat, you lose too much water and salt. This makes it harder for your body to produce more sweat. When you do not sweat enough, your body cannot cool down, and heat exhaustion may result. Heat exhaustion can lead to heatstroke, which is a more serious illness. WHO IS AT RISK FOR HEAT EXHAUSTION? Anyone can get heat exhaustion. However, heat exhaustion is more likely when you are exercising or doing a physical activity. It is also more likely when you are in hot and humid weather or bright sunshine. Heat exhaustion is also more likely to develop in:  People who are age 65 or older.  Children.  People who have a medical condition such as heart disease, poor circulation, sickle cell disease, or high blood pressure.  People who have a fever.  People who are very overweight (obese).  People who are dehydrated from:  Drinking alcohol or caffeine.  Taking certain medicines, such as diuretics or stimulants. WHAT ARE THE SYMPTOMS OF HEAT EXHAUSTION? Symptoms of heat exhaustion include:  A body temperature of up to 104F (40C).  Moist, cool, and clammy skin.  Dizziness.  Headache.  Nausea.  Fatigue.  Thirst.  Dark-colored urine.  Rapid pulse or heartbeat.  Weakness.  Muscle cramps.  Confusion.  Fainting. WHAT SHOULD I DO IF I THINK I HAVE HEAT EXHAUSTION? If you think that you have heat exhaustion, call your health care provider. Follow his or her instructions. You should also:  Call a friend or a family member and ask someone to stay with you.  Move to a cooler location, such as:  Into the shade.  In front of a fan.  Someplace that has air conditioning.  Lie down  and rest.  Slowly drink nonalcoholic, caffeine-free fluids.  Take off any extra clothing or tight-fitting clothes.  Take a cool bath or shower, if possible. If you do not have access to a bath or shower, dab or mist cool water on your skin. WHY IS IT IMPORTANT TO TREAT HEAT EXHAUSTION? It is extremely important to take care of yourself and treat heat exhaustion as soon as possible. Untreated heat exhaustion can turn into heatstroke. Symptoms of heatstroke include:  A body temperature of 104F (40C) or higher.  Hot, red skin that may be dry or moist.  Severe headache.  Nausea and vomiting.  Muscle weakness and cramping.  Confusion.  Rapid breathing.  Fainting.  Seizure. These symptoms may represent a serious problem that is an emergency. Do not wait to see if the symptoms will go away. Get medical help right away. Call your local emergency services (911 in the [place] Do not drive yourself to the hospital. Heatstroke is a life-threatening condition that requires urgent medical treatment. Do not treat heatstroke at home. Heatstroke should be treated by a health care professional and may require hospitalization. At the hospital, you may need to receive fluids through an IV tube:  If you cannot drink any fluids.  If you vomit any fluids that you drink.  If your symptoms do not get better after one hour.  If your symptoms get worse after one hour. HOW CAN I PREVENT HEAT EXHAUSTION?  Avoid outdoor activities on very hot or humid days.    Do not exercise or do other physical activity when you are not feeling well.  Drink plenty of nonalcoholic and caffeine-free fluids before and during physical activity.  Take frequent breaks for rest during physical activity.  Wear light-colored, loose-fitting, and lightweight clothing in the heat.  Wear a hat and use sunscreen when exercising outdoors.  Avoid being outside during the hottest times of the day.  Check with your health  care provider before you start any new activity, especially if you take medicine or have a medical condition.  Start any new activity slowly and work up to your fitness level. HOW CAN I HELP TO PROTECT ELDERLY RELATIVES AND NEIGHBORS FROM HEAT EXHAUSTION? People who are age 65 or older are at greater risk for heat exhaustion. Their bodies have a harder time adjusting to heat. They are also more likely to have a medical condition or be on medicines that increase their risk for heat exhaustion. They may get heat exhaustion indoors if the heat is high for several days. You can help to protect them during hot weather by:  Checking on them two or more times each day.  Making sure that they are drinking plenty of cool, nonalcoholic, and caffeine-free fluids.  Making sure that they use their air conditioner.  Taking them to a location where air conditioning is available.  Talking with their health care provider about their medical needs, medicines, and fluid requirements.   This information is not intended to replace advice given to you by your health care provider. Make sure you discuss any questions you have with your health care provider.   Document Released: 07/02/2008 Document Revised: 06/14/2015 Document Reviewed: 08/31/2014 Elsevier Interactive Patient Education 2016 Elsevier Inc.  

## 2016-03-10 NOTE — ED Provider Notes (Signed)
CSN: [id number]     Arrival date & time 03/10/16  0446 History   First MD Initiated Contact with Patient 03/10/16 0615     Chief Complaint  Patient presents with  . Heat Exposure     Patient is a 25 y.o. male presenting with general illness. The history is provided by the patient.  Illness Location:  Headache, body Quality:  Aching Severity:  Moderate Onset quality:  Gradual Duration:  1 day Timing:  Constant Progression:  Worsening Chronicity:  New Context:  Working outside in the sun Relieved by:  Nothing Worsened by:  Nothing tried Associated symptoms: fatigue and headaches   Associated symptoms: no abdominal pain, no chest pain, no fever, no loss of consciousness, no rash, no shortness of breath and no vomiting   Patient reports he was working the sun all day yesterday.  He reports he sweat excessively.  He reports he has been having HA since working and feeling tired.  No fever/vomiting No cp/sob No other complaints  Past Medical History  Diagnosis Date  . Anxiety 2017    on lorazepam 1 mg tid   . Irregular heart beat 2017   Past Surgical History  Procedure Laterality Date  . None     Family History  Problem Relation Age of Onset  . Birth defects Mother   . Diabetes Mother   . Hypertension Mother   . Kidney disease Mother   . Diabetes Maternal Grandfather   . Hypertension Maternal Grandfather    Social History  Substance Use Topics  . Smoking status: Current Every Day Smoker -- 0.50 packs/day    Types: Cigarettes  . Smokeless tobacco: Never Used  . Alcohol Use: No     Comment: social    Review of Systems  Constitutional: Positive for fatigue. Negative for fever.  Respiratory: Negative for shortness of breath.   Cardiovascular: Negative for chest pain.  Gastrointestinal: Negative for vomiting and abdominal pain.  Skin: Negative for rash.  Neurological: Positive for headaches. Negative for loss of consciousness.  All other systems reviewed and are  negative.     Allergies  Review of patient's allergies indicates no known allergies.  Home Medications   Prior to Admission medications   Medication Sig Start Date End Date Taking? Authorizing Provider  ibuprofen (ADVIL,MOTRIN) 800 MG tablet Take 1 tablet (800 mg total) by mouth 3 (three) times daily. 02/05/16  Yes Danelle BerryLeisa Tapia, PA-C  cyclobenzaprine (FLEXERIL) 10 MG tablet Take 1 tablet (10 mg total) by mouth 2 (two) times daily as needed for muscle spasms. Patient not taking: Reported on 02/19/2016 02/05/16   Danelle BerryLeisa Tapia, PA-C  LORazepam (ATIVAN) 1 MG tablet Take 1 tablet (1 mg total) by mouth 3 (three) times daily as needed for anxiety. 02/27/16   Elpidio AnisShari Upstill, PA-C  traMADol (ULTRAM) 50 MG tablet Take 1 tablet (50 mg total) by mouth every 12 (twelve) hours as needed for severe pain. Patient not taking: Reported on 02/19/2016 02/05/16   Danelle BerryLeisa Tapia, PA-C   BP 129/80 mmHg  Pulse 98  Temp(Src) 98.9 F (37.2 C) (Oral)  Resp 18  SpO2 99% Physical Exam CONSTITUTIONAL: Well developed/well nourished HEAD: Normocephalic/atraumatic EYES: EOMI/PERRL ENMT: Mucous membranes moist NECK: supple no meningeal signs SPINE/BACK:entire spine nontender CV: S1/S2 noted, no murmurs/rubs/gallops noted LUNGS: Lungs are clear to auscultation bilaterally, no apparent distress ABDOMEN: soft, nontender, no rebound or guarding, bowel sounds noted throughout abdomen GU:no cva tenderness NEURO: Pt is awake/alert/appropriate, moves all extremitiesx4.  No facial droop.  No ataxia EXTREMITIES: pulses normal/equal, full ROM SKIN: warm, color normal PSYCH: no abnormalities of mood noted, alert and oriented to situation  ED Course  Procedures Labs Review Labs Reviewed  BASIC METABOLIC PANEL - Abnormal; Notable for the following:    Potassium 3.4 (*)    Glucose, Bld 115 (*)    All other components within normal limits  CBC WITH DIFFERENTIAL/PLATELET  CK    I have personally reviewed and evaluated these  images and lab results as part of my medical decision-making.  Medications  0.9 %  sodium chloride infusion ( Intravenous Stopped 03/10/16 0640)  sodium chloride 0.9 % bolus 1,000 mL (1,000 mLs Intravenous New Bag/Given 03/10/16 1610)  ketorolac (TORADOL) 30 MG/ML injection 30 mg (30 mg Intravenous Given 03/10/16 0642)    Pt with probable mild heat illness Labs reassuring  7:12 AM Pt stable for d/c home   MDM   Final diagnoses:  Heat exposure, initial encounter    Nursing notes including past medical history and social history reviewed and considered in documentation Labs/vital reviewed myself and considered during evaluation     Zadie Rhine, MD 03/10/16 [phone]

## 2016-03-13 ENCOUNTER — Other Ambulatory Visit: Payer: Self-pay | Admitting: Student

## 2016-03-13 ENCOUNTER — Ambulatory Visit (INDEPENDENT_AMBULATORY_CARE_PROVIDER_SITE_OTHER): Payer: Medicaid Other | Admitting: Student

## 2016-03-13 ENCOUNTER — Encounter: Payer: Self-pay | Admitting: Student

## 2016-03-13 VITALS — BP 133/73 | HR 83 | Temp 98.1°F | Wt 235.0 lb

## 2016-03-13 DIAGNOSIS — K219 Gastro-esophageal reflux disease without esophagitis: Secondary | ICD-10-CM | POA: Diagnosis not present

## 2016-03-13 DIAGNOSIS — Z720 Tobacco use: Secondary | ICD-10-CM

## 2016-03-13 DIAGNOSIS — F419 Anxiety disorder, unspecified: Secondary | ICD-10-CM | POA: Diagnosis not present

## 2016-03-13 DIAGNOSIS — F17200 Nicotine dependence, unspecified, uncomplicated: Secondary | ICD-10-CM

## 2016-03-13 MED ORDER — BUPROPION HCL ER (SR) 100 MG PO TB12
100.0000 mg | ORAL_TABLET | Freq: Two times a day (BID) | ORAL | Status: DC
Start: 2016-03-13 — End: 2016-03-13

## 2016-03-13 MED ORDER — LORAZEPAM 1 MG PO TABS: 1.0000 mg | ORAL_TABLET | Freq: Three times a day (TID) | ORAL | Status: AC | PRN

## 2016-03-13 MED ORDER — PANTOPRAZOLE SODIUM 40 MG PO TBEC
40.0000 mg | DELAYED_RELEASE_TABLET | Freq: Every day | ORAL | Status: DC
Start: 2016-03-13 — End: 2021-01-02

## 2016-03-13 NOTE — Patient Instructions (Addendum)
It was great seeing you today! We have addressed the following issues today  1. Smoking: I have sent a prescription to your pharmacy. This is a number to call to talk to professional Counsellor about smoking. Call 1800-QUIT-NOW for help with stopping smoking.  2. Chest pain: this is likely heart burn. It could also be due to anxiety. I have sent prescription to pharmacy for both 3. Anxiety: take lorazepam only to break anxiety attack. Take bupropion twice daily.    If we did any lab work today, and the results require attention, either me or my nurse will get in touch with you. If everything is normal, you will get a letter in mail. If you don't hear from us in two weeks, [place] give us a call. Otherwise, I look forward to talking with you again at our next visit. If you have any questions or concerns before then, please call the clinic at [phone].  Please bring all your medications to every doctors visit   Sign up for My Chart to have easy access to your labs results, and communication with your Primary care physician.    Please check-out at the front desk before leaving the clinic.   Take Care,

## 2016-03-13 NOTE — Progress Notes (Signed)
   Subjective:    Patient ID: Austin Zuniga, male    DOB: 09/23/1991, 25 y.o.   MRN: [id number]  CC: Establish care  HPI patient here to establish care.  Reports history of intermittent chest pain, about two times a week. Pain always over his left chest. He doesn't have chest pain now. Feels like a stabbing pain. Lasts about 30-40 minutes. Pain usually when he is about to go to sleep. Eats dinner about 6 pm. Goes to sleep about 11 pm.  But snacks before going to bed. Reports drinking soda before going to bed. "The anxiety medicine" makes him calm down. Pain resolves with burping. Denies exertional chest pain or dyspnea. 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  Smokes 0.5ppd for 5 years  Drinks EtOH twice a month. Drinks 2x24ox in one sitting.  Reports smoking THC  Reports doing push ups every night. 100 in sets of 20's  Sexual active. One male partner. No protection.  Screenings -Dental exam: hasn't been to dentist in years.  Review of Systems  Constitutional: Negative for fever, diaphoresis, appetite change, fatigue and unexpected weight change.  HENT: Negative for dental problem and trouble swallowing.   Eyes: Negative for visual disturbance.  Respiratory: Negative for cough and chest tightness.   Cardiovascular: Positive for chest pain. Negative for palpitations and leg swelling.       Not at present  Gastrointestinal: Negative for abdominal pain and blood in stool.  Endocrine: Negative for cold intolerance and heat intolerance.  Genitourinary: Negative for dysuria, hematuria, discharge, penile swelling, scrotal swelling and genital sores.  Musculoskeletal: Negative for joint swelling and arthralgias.  Neurological: Negative for dizziness, seizures and syncope.  Psychiatric/Behavioral: Negative for suicidal ideas, hallucinations, behavioral problems, sleep disturbance and self-injury. The patient is nervous/anxious.     Per HPI Objective:   Physical Exam Filed Vitals:   03/13/16 1441  BP:  133/73  Pulse: 83  Temp: 98.1 F (36.7 C)  TempSrc: Oral  Weight: 235 lb (106.595 kg)    GEN: appears well, NAD HEENT: [place]/NT, PERRL, EOMI, TM normal, MMM CVS: RRR, normal s1 and s2, no murmurs, no edema RESP: no increased WOB, good air movement bilaterally, CTAB GI: +BS, soft, non-tender,non-distended,  MSK: symmetric muscle build, no tenderness NEURO: alert and oriented apporopriately, no gross defecits  PSYCH: appropriate mood and affect     Assessment & Plan:  Anxiety Multiple ED visit with chest pain. Cardiac work up always negative.  -Gave Rx for Lorazepam 1 mg #10 -Hold off SSRI as I gave him Wellbutrin for smoking cessation -Follow up in two weeks   Esophageal reflux Gave trial of Protonix for 6 weeks.  Smoking Patient interested in quitting smoking. Interested in talking to professional counselors. Gave him quit number. Discussed about medication options. He would like to try Bupropion. He felt confident about quitting. Motivator: financial relief. Barrier: stress with pending court case. Confidence 8/10. -Gave Rx for Bupropion -Follow up in two weeks to assess progress. -Will try motivational counseling if no success.

## 2016-03-14 DIAGNOSIS — F419 Anxiety disorder, unspecified: Secondary | ICD-10-CM | POA: Insufficient documentation

## 2016-03-14 DIAGNOSIS — F17200 Nicotine dependence, unspecified, uncomplicated: Secondary | ICD-10-CM | POA: Insufficient documentation

## 2016-03-14 DIAGNOSIS — K219 Gastro-esophageal reflux disease without esophagitis: Secondary | ICD-10-CM | POA: Insufficient documentation

## 2016-03-14 NOTE — Assessment & Plan Note (Addendum)
Multiple ED visit with chest pain. Cardiac work up always negative.  -Gave Rx for Lorazepam 1 mg #10 -Hold off SSRI as I gave him Wellbutrin for smoking cessation -Follow up in two weeks

## 2016-03-14 NOTE — Assessment & Plan Note (Signed)
Gave trial of Protonix for 6 weeks.

## 2016-03-14 NOTE — Assessment & Plan Note (Addendum)
Patient interested in quitting smoking. Interested in talking to professional counselors. Gave him quit number. Discussed about medication options. He would like to try Bupropion. He felt confident about quitting. Motivator: financial relief. Barrier: stress with pending court case. Confidence 8/10. -Gave Rx for Bupropion -Follow up in two weeks to assess progress. -Will try motivational counseling if no success.

## 2016-04-23 ENCOUNTER — Other Ambulatory Visit: Payer: Self-pay | Admitting: *Deleted

## 2016-04-23 DIAGNOSIS — F419 Anxiety disorder, unspecified: Secondary | ICD-10-CM

## 2016-04-24 NOTE — Telephone Encounter (Signed)
Attempted to call the patient this morning in regards to his refill request on Ativan. He is not picking his phone. Doesn't seem he has a voicemail to leave a voice message. I saw patient in the office on 03/14/2016. The plan was to return in 2 weeks which didn't happen.   Please advise him to schedule an appointment with me before I refill his prescription. Thanks!

## 2016-04-24 NOTE — Telephone Encounter (Signed)
Patient has an appt on 05/03/16. Austin Zuniga,CMA

## 2016-05-03 ENCOUNTER — Ambulatory Visit: Payer: Medicaid Other | Admitting: Student

## 2016-05-10 ENCOUNTER — Ambulatory Visit: Payer: Medicaid Other | Admitting: Student

## 2016-05-14 ENCOUNTER — Ambulatory Visit: Payer: Medicaid Other | Admitting: Student

## 2016-05-29 ENCOUNTER — Ambulatory Visit: Payer: Medicaid Other | Admitting: Student

## 2020-12-26 ENCOUNTER — Encounter (HOSPITAL_COMMUNITY): Payer: Self-pay

## 2020-12-26 ENCOUNTER — Emergency Department (HOSPITAL_COMMUNITY): Payer: Medicaid Other

## 2020-12-26 ENCOUNTER — Other Ambulatory Visit: Payer: Self-pay

## 2020-12-26 ENCOUNTER — Emergency Department (HOSPITAL_COMMUNITY)
Admission: EM | Admit: 2020-12-26 | Discharge: 2020-12-26 | Disposition: A | Discharge status: Home / Self Care | Payer: Medicaid Other | Attending: Emergency Medicine | Admitting: Emergency Medicine

## 2020-12-26 DIAGNOSIS — J039 Acute tonsillitis, unspecified: Secondary | ICD-10-CM | POA: Diagnosis not present

## 2020-12-26 DIAGNOSIS — F17210 Nicotine dependence, cigarettes, uncomplicated: Secondary | ICD-10-CM | POA: Insufficient documentation

## 2020-12-26 DIAGNOSIS — H9201 Otalgia, right ear: Secondary | ICD-10-CM | POA: Diagnosis present

## 2020-12-26 LAB — CBC WITH DIFFERENTIAL/PLATELET
Abs Immature Granulocytes: 0.09 10*3/uL — ABNORMAL HIGH (ref 0.00–0.07)
Basophils Absolute: 0.1 10*3/uL (ref 0.0–0.1)
Basophils Relative: 0 %
Eosinophils Absolute: 0 10*3/uL (ref 0.0–0.5)
Eosinophils Relative: 0 %
HCT: 47.8 % (ref 39.0–52.0)
Hemoglobin: 16.3 g/dL (ref 13.0–17.0)
Immature Granulocytes: 1 %
Lymphocytes Relative: 10 %
Lymphs Abs: 1.6 10*3/uL (ref 0.7–4.0)
MCH: 30.7 pg (ref 26.0–34.0)
MCHC: 34.1 g/dL (ref 30.0–36.0)
MCV: 90 fL (ref 80.0–100.0)
Monocytes Absolute: 1.5 10*3/uL — ABNORMAL HIGH (ref 0.1–1.0)
Monocytes Relative: 9 %
Neutro Abs: 13.7 10*3/uL — ABNORMAL HIGH (ref 1.7–7.7)
Neutrophils Relative %: 80 %
Platelets: 259 10*3/uL (ref 150–400)
RBC: 5.31 MIL/uL (ref 4.22–5.81)
RDW: 13.3 % (ref 11.5–15.5)
WBC: 16.9 10*3/uL — ABNORMAL HIGH (ref 4.0–10.5)
nRBC: 0 % (ref 0.0–0.2)

## 2020-12-26 LAB — BASIC METABOLIC PANEL
Anion gap: 9 (ref 5–15)
BUN: 13 mg/dL (ref 6–20)
CO2: 26 mmol/L (ref 22–32)
Calcium: 9.3 mg/dL (ref 8.9–10.3)
Chloride: 103 mmol/L (ref 98–111)
Creatinine, Ser: 0.92 mg/dL (ref 0.61–1.24)
GFR, Estimated: >60 mL/min (ref >60)
Glucose, Bld: 114 mg/dL — ABNORMAL HIGH (ref 70–99)
Potassium: 3.7 mmol/L (ref 3.5–5.1)
Sodium: 138 mmol/L (ref 135–145)

## 2020-12-26 LAB — GROUP A STREP BY PCR: Group A Strep by PCR: NOT DETECTED

## 2020-12-26 MED ORDER — CLINDAMYCIN PHOSPHATE 600 MG/50ML IV SOLN
600.0000 mg | Freq: Once | INTRAVENOUS | Status: AC
  Administered 2020-12-26: 600 mg via INTRAVENOUS
  Filled 2020-12-26: qty 50

## 2020-12-26 MED ORDER — KETOROLAC TROMETHAMINE 30 MG/ML IJ SOLN
30.0000 mg | Freq: Once | INTRAMUSCULAR | Status: AC
  Administered 2020-12-26: 30 mg via INTRAVENOUS
  Filled 2020-12-26: qty 1

## 2020-12-26 MED ORDER — IOHEXOL 300 MG/ML  SOLN
75.0000 mL | Freq: Once | INTRAMUSCULAR | Status: AC | PRN
  Administered 2020-12-26: 75 mL via INTRAVENOUS

## 2020-12-26 MED ORDER — CLINDAMYCIN HCL 150 MG PO CAPS: 450.0000 mg | ORAL_CAPSULE | Freq: Three times a day (TID) | ORAL | 0 refills | Status: DC

## 2020-12-26 MED ORDER — SODIUM CHLORIDE 0.9 % IV BOLUS
1000.0000 mL | Freq: Once | INTRAVENOUS | Status: AC
  Administered 2020-12-26: 1000 mL via INTRAVENOUS

## 2020-12-26 MED ORDER — DEXAMETHASONE SODIUM PHOSPHATE 10 MG/ML IJ SOLN
10.0000 mg | Freq: Once | INTRAMUSCULAR | Status: AC
  Administered 2020-12-26: 10 mg via INTRAVENOUS
  Filled 2020-12-26: qty 1

## 2020-12-26 MED ORDER — IBUPROFEN 600 MG PO TABS: 600.0000 mg | ORAL_TABLET | Freq: Four times a day (QID) | ORAL | 0 refills | Status: DC | PRN

## 2020-12-26 MED ORDER — ACETAMINOPHEN 500 MG PO TABS
1000.0000 mg | ORAL_TABLET | Freq: Once | ORAL | Status: AC
  Administered 2020-12-26: 1000 mg via ORAL
  Filled 2020-12-26: qty 2

## 2020-12-26 NOTE — ED Notes (Addendum)
Provided pt education on fever treatment and followup. Delford Field MD notified, orders for 1g tylenol received

## 2020-12-26 NOTE — ED Triage Notes (Signed)
Pt reports right sided ear pain and throat swelling for 3 days.

## 2020-12-26 NOTE — ED Notes (Signed)
Pt provided with juice and graham crackers. Able to swallow

## 2020-12-26 NOTE — ED Provider Notes (Signed)
[facility]-EMERGENCY DEPT Provider Note   CSN: [id number] Arrival date & time: 12/26/20  4132     History Chief Complaint  Patient presents with  . Ear Pain    Austin Zuniga is a 30 y.o. male with no relevant past medical history presents the ED with complaints of right-sided ear pain.  On my examination, patient is endorsing a 3-day history of atraumatic right-sided mandibular pain and swelling.  He states that his discomfort radiates towards his right ear and he thought that perhaps he also had an ear infection.  He states that this is never happened before.  He has been taking Tylenol and Motrin frequently, without any relief in symptoms.  He states that he was drooling in the middle of the night.  He can fully open his mouth, but has noticed a voice change.  He endorses subjective fevers and chills.  Patient states that he has not been able to eat and drink well due to pain and difficulty with swallowing.  Patient has children, but states that they have not been ill.  Denies any obvious sick contacts.  I personally rechecked his temperature and he was febrile to 100.6 F and states that he had just taken antipyretics not long prior to arrival.  HPI     Past Medical History:  Diagnosis Date  . Anxiety 2017   on lorazepam 1 mg tid   . Irregular heart beat 2017    Patient Active Problem List   Diagnosis Date Noted  . Anxiety 03/14/2016  . Smoking 03/14/2016  . Esophageal reflux 03/14/2016    Past Surgical History:  Procedure Laterality Date  . none         Family History  Problem Relation Age of Onset  . Birth defects Mother   . Diabetes Mother   . Hypertension Mother   . Kidney disease Mother   . Diabetes Maternal Grandfather   . Hypertension Maternal Grandfather     Social History   Tobacco Use  . Smoking status: Current Every Day Smoker    Packs/day: 0.50    Types: Cigarettes  . Smokeless tobacco: Never Used  Substance Use Topics   . Alcohol use: No    Alcohol/week: 0.0 standard drinks    Comment: social  . Drug use: No    Frequency: 5.0 times per week    Home Medications Prior to Admission medications   Medication Sig Start Date End Date Taking? Authorizing Provider  clindamycin (CLEOCIN) 150 MG capsule Take 3 capsules (450 mg total) by mouth 3 (three) times daily for 7 days. 12/26/20 01/02/21 Yes Lorelee New, PA-C  ibuprofen (ADVIL) 600 MG tablet Take 1 tablet (600 mg total) by mouth every 6 (six) hours as needed. 12/26/20  Yes Lorelee New, PA-C  buPROPion (WELLBUTRIN SR) 100 MG 12 hr tablet TAKE 1 TABLET BY MOUTH TWICE DAILY. Patient not taking: Reported on 12/26/2020 03/14/16   Almon Hercules, MD  LORazepam (ATIVAN) 1 MG tablet Take 1 tablet (1 mg total) by mouth 3 (three) times daily as needed for anxiety. Patient not taking: Reported on 12/26/2020 03/13/16   Almon Hercules, MD  pantoprazole (PROTONIX) 40 MG tablet Take 1 tablet (40 mg total) by mouth daily. Patient not taking: Reported on 12/26/2020 03/13/16   Almon Hercules, MD    Allergies    Patient has no known allergies.  Review of Systems   Review of Systems  All other systems reviewed and are negative.  Physical Exam Updated Vital Signs BP 128/73   Pulse 98   Temp (!) 100.6 F (38.1 C) (Oral)   Resp 17   Ht 6\' 1"  (1.854 m)   Wt 108.9 kg   SpO2 99%   BMI 31.66 kg/m   Physical Exam Vitals and nursing note reviewed. Exam conducted with a chaperone present.  Constitutional:      Appearance: He is ill-appearing.  HENT:     Head: Normocephalic and atraumatic.     Right Ear: Tympanic membrane, ear canal and external ear normal. There is no impacted cerumen.     Left Ear: Tympanic membrane, ear canal and external ear normal. There is no impacted cerumen.     Mouth/Throat:     Pharynx: Posterior oropharyngeal erythema present.     Comments: Significant tonsillar hypertrophy and erythema, most notably over right posterior oropharynx.  Mild  uvular deviation to the left.  No significant trismus.  Tolerating secretions.  Mild right submandibular swelling and tenderness.  No overlying erythema.  No dental injury or poor dentition. Eyes:     General: No scleral icterus.    Conjunctiva/sclera: Conjunctivae normal.  Cardiovascular:     Rate and Rhythm: Regular rhythm. Tachycardia present.     Pulses: Normal pulses.  Pulmonary:     Effort: Pulmonary effort is normal. No respiratory distress.     Breath sounds: No stridor. No wheezing or rales.  Musculoskeletal:     Cervical back: Normal range of motion.  Skin:    General: Skin is dry.  Neurological:     Mental Status: He is alert.     GCS: GCS eye subscore is 4. GCS verbal subscore is 5. GCS motor subscore is 6.  Psychiatric:        Mood and Affect: Mood normal.        Behavior: Behavior normal.        Thought Content: Thought content normal.     ED Results / Procedures / Treatments   Labs (all labs ordered are listed, but only abnormal results are displayed) Labs Reviewed  CBC WITH DIFFERENTIAL/PLATELET - Abnormal; Notable for the following components:      Result Value   WBC 16.9 (*)    Neutro Abs 13.7 (*)    Monocytes Absolute 1.5 (*)    Abs Immature Granulocytes 0.09 (*)    All other components within normal limits  BASIC METABOLIC PANEL - Abnormal; Notable for the following components:   Glucose, Bld 114 (*)    All other components within normal limits  GROUP A STREP BY PCR    EKG None  Radiology CT Soft Tissue Neck W Contrast  Result Date: 12/26/2020 CLINICAL DATA:  Right ear and neck pain over the last 3 days. Question abscess. EXAM: CT NECK WITH CONTRAST TECHNIQUE: Multidetector CT imaging of the neck was performed using the standard protocol following the bolus administration of intravenous contrast. CONTRAST:  70mL OMNIPAQUE IOHEXOL 300 MG/ML  SOLN COMPARISON:  None. FINDINGS: Pharynx and larynx: Advanced right tonsillitis with considerable inflammation  of the parapharyngeal space extending from the level of the tonsils Zuniga as far as the larynx. Mass-effect upon the right side of the oropharynx and hypopharynx. Phlegmonous inflammation throughout this region, possibly with small actual peritonsillar abscess formation. Difficult to see a discrete well-marginated abscess. The left tonsil is prominent as well, but without evidence of advanced inflammatory change or abscess. Salivary glands: Parotid and submandibular glands are normal. Thyroid: Normal Lymph nodes: No asymmetric nodal  enlargement or suppuration. Vascular: Normal Limited intracranial: Normal Visualized orbits: Normal Mastoids and visualized paranasal sinuses: Clear Skeleton: Normal Upper chest: Negative Other: None IMPRESSION: Advanced right tonsillitis with considerable inflammation of the right parapharyngeal space extending from the level of the tonsils Zuniga as far as the larynx. Mass-effect upon the right side of the oropharynx and hypopharynx. Difficult to see a discrete well-marginated abscess, but because of the considerable phlegmonous inflammation, small abscesses could be hidden. The left tonsil is prominent as well, but without evidence of advanced inflammatory change or abscess. Electronically Signed   By: Paulina FusiMark  Shogry M.D.   On: 12/26/2020 08:55    Procedures Procedures   Medications Ordered in ED Medications  sodium chloride 0.9 % bolus 1,000 mL (0 mLs Intravenous Stopped 12/26/20 0814)  ketorolac (TORADOL) 30 MG/ML injection 30 mg (30 mg Intravenous Given 12/26/20 0730)  iohexol (OMNIPAQUE) 300 MG/ML solution 75 mL (75 mLs Intravenous Contrast Given 12/26/20 0830)  clindamycin (CLEOCIN) IVPB 600 mg (0 mg Intravenous Stopped 12/26/20 0946)  dexamethasone (DECADRON) injection 10 mg (10 mg Intravenous Given 12/26/20 1057)    ED Course  I have reviewed the triage vital signs and the nursing notes.  Pertinent labs & imaging results that were available during my care of the patient  were reviewed by me and considered in my medical decision making (see chart for details).    MDM Rules/Calculators/A&P                          Austin Zuniga was evaluated in Emergency Department on 12/26/2020 for the symptoms described in the history of present illness. He was evaluated in the context of the global COVID-19 pandemic, which necessitated consideration that the patient might be at risk for infection with the SARS-CoV-2 virus that causes COVID-19. Institutional protocols and algorithms that pertain to the evaluation of patients at risk for COVID-19 are in a state of rapid change based on information released by regulatory bodies including the CDC and federal and state organizations. These policies and algorithms were followed during the patient's care in the ED.  I personally reviewed patient's medical chart and all notes from triage and staff during today's encounter. I have also ordered and reviewed all labs and imaging that I felt to be medically necessary in the evaluation of this patient's complaints and with consideration of their physical exam. If needed, translation services were available and utilized.   Patient able to speak in complete sentences without difficulty swallowing or trouble handling secretions.  No stridor, wheezing, tripoding, grey pseudomembrane, trismus, nuchal rigidity, or neck pain.  Low suspicion for vertebral dissection, meningitis, or other emergent pathology.    However, mild uvular deviation to the left and swelling on right side.  States that he drools at night.  Mild voice change.  Will obtain CT soft tissue neck with contrast for further delineation.  Patient will be treated with Toradol.  Laboratory work-up notable for leukocytosis 16.9.  Group A strep by PCR is negative.  CT demonstrates advanced right tonsillitis with considerable inflammation of the right parapharyngeal space extending from the level of the tonsils not as far as the larynx.   Mass-effect on the right side of the oropharynx.  No obvious abscesses.  Patient was able to eat and drink here in the ED.  No respiratory compromise.  Patent oropharynx despite swelling and inflammation.  Discussed conservative therapy such as warm tea with honey, Chloraseptic spray, throat lozenges, and salt-water  gargles.  Encouraged NSAIDs and emphasized importance of oral hydration and antipyretics as needed for fever control.  Will prescribe clindamycin for continued bacterial coverage.  Discussed that it may be viral in etiology.  Will provide referral to ENT and encourage close outpatient follow-up with a primary care provider.  ED return precautions discussed.  Strict ED return precautions.  Patient voices understanding and is agreeable to the plan.   Final Clinical Impression(s) / ED Diagnoses Final diagnoses:  Acute tonsillitis, unspecified etiology    Rx / DC Orders ED Discharge Orders         Ordered    clindamycin (CLEOCIN) 150 MG capsule  3 times daily        12/26/20 1057    Ambulatory referral to ENT       Comments: Advanced tonsillitis   12/26/20 1057    ibuprofen (ADVIL) 600 MG tablet  Every 6 hours PRN        12/26/20 1057           Elvera Maria 12/26/20 1104    Koleen Distance, MD 12/27/20 [phone]

## 2020-12-26 NOTE — Discharge Instructions (Addendum)
Your findings here in the ED are suggestive of advanced right-sided tonsillitis.  Unclear as to whether or not this is viral or bacterial in etiology, but continue to take the clindamycin antibiotics, as prescribed.  I would also like you to take ibuprofen 600 mg 6 hours as needed for inflammation and discomfort.  You may also take over-the-counter Chloraseptic lozenges and perform salt water gargles.  Please follow-up with your primary care provider regarding today's encounter, as well.  If you do not have one, please call the [facility] to get established.  Return to the ED or seek immediate medical attention should you experience any new or worsening symptoms.  These include but are not limited to any difficulty breathing, inability to swallow, fevers uncontrolled with Tylenol and ibuprofen, drooling, or inability to open your mouth.

## 2020-12-27 ENCOUNTER — Encounter (HOSPITAL_COMMUNITY): Payer: Self-pay | Admitting: *Deleted

## 2020-12-27 ENCOUNTER — Other Ambulatory Visit: Payer: Self-pay

## 2020-12-27 ENCOUNTER — Inpatient Hospital Stay (HOSPITAL_COMMUNITY)
Admission: EM | Admit: 2020-12-27 | Discharge: 2021-01-02 | DRG: 872 | Disposition: A | Discharge status: Home / Self Care | Payer: Medicaid Other | Attending: Internal Medicine | Admitting: Internal Medicine

## 2020-12-27 DIAGNOSIS — E876 Hypokalemia: Secondary | ICD-10-CM | POA: Diagnosis present

## 2020-12-27 DIAGNOSIS — K219 Gastro-esophageal reflux disease without esophagitis: Secondary | ICD-10-CM | POA: Diagnosis present

## 2020-12-27 DIAGNOSIS — A419 Sepsis, unspecified organism: Secondary | ICD-10-CM | POA: Diagnosis present

## 2020-12-27 DIAGNOSIS — E860 Dehydration: Secondary | ICD-10-CM

## 2020-12-27 DIAGNOSIS — F419 Anxiety disorder, unspecified: Secondary | ICD-10-CM | POA: Diagnosis present

## 2020-12-27 DIAGNOSIS — Z833 Family history of diabetes mellitus: Secondary | ICD-10-CM

## 2020-12-27 DIAGNOSIS — Z20822 Contact with and (suspected) exposure to covid-19: Secondary | ICD-10-CM | POA: Diagnosis present

## 2020-12-27 DIAGNOSIS — Z841 Family history of disorders of kidney and ureter: Secondary | ICD-10-CM

## 2020-12-27 DIAGNOSIS — Z8249 Family history of ischemic heart disease and other diseases of the circulatory system: Secondary | ICD-10-CM

## 2020-12-27 DIAGNOSIS — J0510 Acute epiglottitis without obstruction: Secondary | ICD-10-CM | POA: Diagnosis present

## 2020-12-27 DIAGNOSIS — J36 Peritonsillar abscess: Secondary | ICD-10-CM | POA: Diagnosis present

## 2020-12-27 DIAGNOSIS — F17210 Nicotine dependence, cigarettes, uncomplicated: Secondary | ICD-10-CM | POA: Diagnosis present

## 2020-12-27 DIAGNOSIS — R001 Bradycardia, unspecified: Secondary | ICD-10-CM | POA: Diagnosis present

## 2020-12-27 DIAGNOSIS — R590 Localized enlarged lymph nodes: Secondary | ICD-10-CM | POA: Diagnosis present

## 2020-12-27 DIAGNOSIS — E669 Obesity, unspecified: Secondary | ICD-10-CM | POA: Diagnosis present

## 2020-12-27 DIAGNOSIS — R000 Tachycardia, unspecified: Secondary | ICD-10-CM | POA: Diagnosis present

## 2020-12-27 DIAGNOSIS — A414 Sepsis due to anaerobes: Principal | ICD-10-CM | POA: Diagnosis present

## 2020-12-27 DIAGNOSIS — J039 Acute tonsillitis, unspecified: Principal | ICD-10-CM

## 2020-12-27 DIAGNOSIS — Z6833 Body mass index (BMI) 33.0-33.9, adult: Secondary | ICD-10-CM

## 2020-12-27 DIAGNOSIS — J0430 Supraglottitis, unspecified, without obstruction: Secondary | ICD-10-CM | POA: Diagnosis present

## 2020-12-27 NOTE — ED Triage Notes (Signed)
Pt c/o sore throat x 4 day, tenderness on palpation to R side of neck. Was seen at [facility] and had a -CT and -strep. Given prescription for ibuprofen and antiobiotics. Unable to swallow pills or liquid

## 2020-12-28 ENCOUNTER — Encounter (HOSPITAL_COMMUNITY): Payer: Self-pay | Admitting: Internal Medicine

## 2020-12-28 ENCOUNTER — Emergency Department (HOSPITAL_COMMUNITY): Payer: Medicaid Other

## 2020-12-28 ENCOUNTER — Other Ambulatory Visit: Payer: Self-pay

## 2020-12-28 DIAGNOSIS — R000 Tachycardia, unspecified: Secondary | ICD-10-CM | POA: Diagnosis present

## 2020-12-28 DIAGNOSIS — E860 Dehydration: Secondary | ICD-10-CM

## 2020-12-28 DIAGNOSIS — Z833 Family history of diabetes mellitus: Secondary | ICD-10-CM | POA: Diagnosis not present

## 2020-12-28 DIAGNOSIS — Z6833 Body mass index (BMI) 33.0-33.9, adult: Secondary | ICD-10-CM | POA: Diagnosis not present

## 2020-12-28 DIAGNOSIS — A419 Sepsis, unspecified organism: Secondary | ICD-10-CM | POA: Diagnosis not present

## 2020-12-28 DIAGNOSIS — J039 Acute tonsillitis, unspecified: Secondary | ICD-10-CM | POA: Diagnosis present

## 2020-12-28 DIAGNOSIS — R001 Bradycardia, unspecified: Secondary | ICD-10-CM | POA: Diagnosis present

## 2020-12-28 DIAGNOSIS — F419 Anxiety disorder, unspecified: Secondary | ICD-10-CM | POA: Diagnosis present

## 2020-12-28 DIAGNOSIS — R590 Localized enlarged lymph nodes: Secondary | ICD-10-CM | POA: Diagnosis present

## 2020-12-28 DIAGNOSIS — Z841 Family history of disorders of kidney and ureter: Secondary | ICD-10-CM | POA: Diagnosis not present

## 2020-12-28 DIAGNOSIS — E876 Hypokalemia: Secondary | ICD-10-CM | POA: Diagnosis present

## 2020-12-28 DIAGNOSIS — J36 Peritonsillar abscess: Secondary | ICD-10-CM

## 2020-12-28 DIAGNOSIS — Z8249 Family history of ischemic heart disease and other diseases of the circulatory system: Secondary | ICD-10-CM | POA: Diagnosis not present

## 2020-12-28 DIAGNOSIS — J0510 Acute epiglottitis without obstruction: Secondary | ICD-10-CM | POA: Diagnosis present

## 2020-12-28 DIAGNOSIS — E669 Obesity, unspecified: Secondary | ICD-10-CM | POA: Diagnosis present

## 2020-12-28 DIAGNOSIS — J0430 Supraglottitis, unspecified, without obstruction: Secondary | ICD-10-CM | POA: Diagnosis present

## 2020-12-28 DIAGNOSIS — F17210 Nicotine dependence, cigarettes, uncomplicated: Secondary | ICD-10-CM

## 2020-12-28 DIAGNOSIS — K219 Gastro-esophageal reflux disease without esophagitis: Secondary | ICD-10-CM | POA: Diagnosis present

## 2020-12-28 DIAGNOSIS — A414 Sepsis due to anaerobes: Secondary | ICD-10-CM | POA: Diagnosis present

## 2020-12-28 DIAGNOSIS — Z20822 Contact with and (suspected) exposure to covid-19: Secondary | ICD-10-CM | POA: Diagnosis present

## 2020-12-28 LAB — COMPREHENSIVE METABOLIC PANEL
ALT: 40 U/L (ref 0–44)
AST: 28 U/L (ref 15–41)
Albumin: 3.6 g/dL (ref 3.5–5.0)
Alkaline Phosphatase: 68 U/L (ref 38–126)
Anion gap: 9 (ref 5–15)
BUN: 21 mg/dL — ABNORMAL HIGH (ref 6–20)
CO2: 25 mmol/L (ref 22–32)
Calcium: 9.2 mg/dL (ref 8.9–10.3)
Chloride: 104 mmol/L (ref 98–111)
Creatinine, Ser: 1.06 mg/dL (ref 0.61–1.24)
GFR, Estimated: >60 mL/min (ref >60)
Glucose, Bld: 110 mg/dL — ABNORMAL HIGH (ref 70–99)
Potassium: 3.5 mmol/L (ref 3.5–5.1)
Sodium: 138 mmol/L (ref 135–145)
Total Bilirubin: 1 mg/dL (ref 0.3–1.2)
Total Protein: 7.8 g/dL (ref 6.5–8.1)

## 2020-12-28 LAB — CBC WITH DIFFERENTIAL/PLATELET
Abs Immature Granulocytes: 0.04 10*3/uL (ref 0.00–0.07)
Basophils Absolute: 0 10*3/uL (ref 0.0–0.1)
Basophils Relative: 0 %
Eosinophils Absolute: 0 10*3/uL (ref 0.0–0.5)
Eosinophils Relative: 0 %
HCT: 43.3 % (ref 39.0–52.0)
Hemoglobin: 15 g/dL (ref 13.0–17.0)
Immature Granulocytes: 0 %
Lymphocytes Relative: 9 %
Lymphs Abs: 0.8 10*3/uL (ref 0.7–4.0)
MCH: 31.3 pg (ref 26.0–34.0)
MCHC: 34.6 g/dL (ref 30.0–36.0)
MCV: 90.4 fL (ref 80.0–100.0)
Monocytes Absolute: 1 10*3/uL (ref 0.1–1.0)
Monocytes Relative: 10 %
Neutro Abs: 7.7 10*3/uL (ref 1.7–7.7)
Neutrophils Relative %: 81 %
Platelets: 215 10*3/uL (ref 150–400)
RBC: 4.79 MIL/uL (ref 4.22–5.81)
RDW: 13.5 % (ref 11.5–15.5)
WBC: 9.5 10*3/uL (ref 4.0–10.5)
nRBC: 0 % (ref 0.0–0.2)

## 2020-12-28 LAB — URINALYSIS, ROUTINE W REFLEX MICROSCOPIC
Bilirubin Urine: NEGATIVE
Glucose, UA: NEGATIVE mg/dL
Hgb urine dipstick: NEGATIVE
Ketones, ur: 80 mg/dL — AB
Leukocytes,Ua: NEGATIVE
Nitrite: NEGATIVE
Protein, ur: NEGATIVE mg/dL
Specific Gravity, Urine: 1.045 — ABNORMAL HIGH (ref 1.005–1.030)
pH: 6 (ref 5.0–8.0)

## 2020-12-28 LAB — HEMOGLOBIN A1C
Hgb A1c MFr Bld: 5.2 % (ref 4.8–5.6)
Mean Plasma Glucose: 102.54 mg/dL

## 2020-12-28 LAB — RESP PANEL BY RT-PCR (FLU A&B, COVID) ARPGX2
Influenza A by PCR: NEGATIVE
Influenza B by PCR: NEGATIVE
SARS Coronavirus 2 by RT PCR: NEGATIVE

## 2020-12-28 LAB — LACTIC ACID, PLASMA
Lactic Acid, Venous: 1.3 mmol/L (ref 0.5–1.9)
Lactic Acid, Venous: 1.3 mmol/L (ref 0.5–1.9)

## 2020-12-28 LAB — PROTIME-INR
INR: 1.2 (ref 0.8–1.2)
Prothrombin Time: 14.6 seconds (ref 11.4–15.2)

## 2020-12-28 LAB — APTT: aPTT: 26 seconds (ref 24–36)

## 2020-12-28 LAB — HIV ANTIBODY (ROUTINE TESTING W REFLEX): HIV Screen 4th Generation wRfx: NONREACTIVE

## 2020-12-28 MED ORDER — LACTATED RINGERS IV SOLN: INTRAVENOUS | Status: DC

## 2020-12-28 MED ORDER — PHENOL 1.4 % MT LIQD
1.0000 | OROMUCOSAL | Status: DC | PRN
  Administered 2020-12-29: 1 via OROMUCOSAL
  Filled 2020-12-28: qty 177

## 2020-12-28 MED ORDER — PANTOPRAZOLE SODIUM 40 MG IV SOLR
40.0000 mg | Freq: Every day | INTRAVENOUS | Status: DC
  Administered 2020-12-28 – 2021-01-01 (×5): 40 mg via INTRAVENOUS
  Filled 2020-12-28 (×5): qty 40

## 2020-12-28 MED ORDER — DEXTROSE-NACL 5-0.9 % IV SOLN: INTRAVENOUS | Status: DC

## 2020-12-28 MED ORDER — IOHEXOL 350 MG/ML SOLN: 75.0000 mL | Freq: Once | INTRAVENOUS | Status: DC | PRN

## 2020-12-28 MED ORDER — ACETAMINOPHEN 160 MG/5ML PO SOLN: 650.0000 mg | ORAL | Status: DC | PRN

## 2020-12-28 MED ORDER — ONDANSETRON HCL 4 MG/2ML IJ SOLN: 4.0000 mg | Freq: Four times a day (QID) | INTRAMUSCULAR | Status: DC | PRN

## 2020-12-28 MED ORDER — SODIUM CHLORIDE 0.9 % IV SOLN: INTRAVENOUS | Status: DC

## 2020-12-28 MED ORDER — MORPHINE SULFATE (PF) 4 MG/ML IV SOLN
4.0000 mg | INTRAVENOUS | Status: DC | PRN
  Administered 2020-12-28 – 2020-12-29 (×3): 4 mg via INTRAVENOUS
  Filled 2020-12-28 (×3): qty 1

## 2020-12-28 MED ORDER — ACETAMINOPHEN 160 MG/5ML PO SOLN
1000.0000 mg | ORAL | Status: DC | PRN
  Administered 2020-12-28: 1000 mg via ORAL
  Filled 2020-12-28: qty 40.6

## 2020-12-28 MED ORDER — LACTATED RINGERS IV BOLUS (SEPSIS)
1000.0000 mL | Freq: Once | INTRAVENOUS | Status: AC
  Administered 2020-12-28: 1000 mL via INTRAVENOUS

## 2020-12-28 MED ORDER — DEXAMETHASONE SODIUM PHOSPHATE 10 MG/ML IJ SOLN
10.0000 mg | Freq: Once | INTRAMUSCULAR | Status: AC
  Administered 2020-12-28: 10 mg via INTRAVENOUS
  Filled 2020-12-28: qty 1

## 2020-12-28 MED ORDER — VANCOMYCIN HCL 2000 MG/400ML IV SOLN
2000.0000 mg | Freq: Once | INTRAVENOUS | Status: AC
  Administered 2020-12-28: 2000 mg via INTRAVENOUS
  Filled 2020-12-28: qty 400

## 2020-12-28 MED ORDER — IOHEXOL 300 MG/ML  SOLN
75.0000 mL | Freq: Once | INTRAMUSCULAR | Status: AC | PRN
  Administered 2020-12-28: 75 mL via INTRAVENOUS

## 2020-12-28 MED ORDER — CLINDAMYCIN PHOSPHATE 900 MG/50ML IV SOLN
900.0000 mg | Freq: Once | INTRAVENOUS | Status: AC
  Administered 2020-12-28: 900 mg via INTRAVENOUS
  Filled 2020-12-28: qty 50

## 2020-12-28 MED ORDER — VANCOMYCIN HCL 1250 MG/250ML IV SOLN
1250.0000 mg | Freq: Three times a day (TID) | INTRAVENOUS | Status: DC
  Filled 2020-12-28 (×3): qty 250

## 2020-12-28 MED ORDER — MORPHINE SULFATE (PF) 2 MG/ML IV SOLN
2.0000 mg | INTRAVENOUS | Status: DC | PRN
  Administered 2020-12-28 – 2020-12-29 (×2): 2 mg via INTRAVENOUS
  Filled 2020-12-28 (×2): qty 1

## 2020-12-28 MED ORDER — FENTANYL CITRATE (PF) 100 MCG/2ML IJ SOLN
100.0000 ug | Freq: Once | INTRAMUSCULAR | Status: AC
  Administered 2020-12-28: 100 ug via INTRAVENOUS
  Filled 2020-12-28: qty 2

## 2020-12-28 MED ORDER — KETOROLAC TROMETHAMINE 15 MG/ML IJ SOLN
15.0000 mg | Freq: Four times a day (QID) | INTRAMUSCULAR | Status: DC
  Administered 2020-12-28: 15 mg via INTRAVENOUS
  Filled 2020-12-28: qty 1

## 2020-12-28 MED ORDER — ENOXAPARIN SODIUM 40 MG/0.4ML ~~LOC~~ SOLN
40.0000 mg | Freq: Every day | SUBCUTANEOUS | Status: DC
  Administered 2020-12-28 – 2021-01-01 (×2): 40 mg via SUBCUTANEOUS
  Filled 2020-12-28 (×3): qty 0.4

## 2020-12-28 MED ORDER — ONDANSETRON HCL 4 MG PO TABS: 4.0000 mg | ORAL_TABLET | Freq: Four times a day (QID) | ORAL | Status: DC | PRN

## 2020-12-28 MED ORDER — SODIUM CHLORIDE 0.9 % IV SOLN
3.0000 g | Freq: Four times a day (QID) | INTRAVENOUS | Status: DC
  Administered 2020-12-28 – 2021-01-02 (×21): 3 g via INTRAVENOUS
  Filled 2020-12-28: qty 3
  Filled 2020-12-28: qty 8
  Filled 2020-12-28: qty 3
  Filled 2020-12-28: qty 8
  Filled 2020-12-28 (×2): qty 3
  Filled 2020-12-28: qty 8
  Filled 2020-12-28: qty 3
  Filled 2020-12-28: qty 8
  Filled 2020-12-28: qty 3
  Filled 2020-12-28: qty 8
  Filled 2020-12-28: qty 3
  Filled 2020-12-28 (×2): qty 8
  Filled 2020-12-28 (×2): qty 3
  Filled 2020-12-28: qty 8
  Filled 2020-12-28: qty 3
  Filled 2020-12-28: qty 8
  Filled 2020-12-28 (×3): qty 3
  Filled 2020-12-28: qty 8
  Filled 2020-12-28: qty 3

## 2020-12-28 MED ORDER — LACTATED RINGERS IV BOLUS (SEPSIS)
500.0000 mL | Freq: Once | INTRAVENOUS | Status: AC
  Administered 2020-12-28: 500 mL via INTRAVENOUS

## 2020-12-28 MED ORDER — NICOTINE 14 MG/24HR TD PT24
14.0000 mg | MEDICATED_PATCH | Freq: Every day | TRANSDERMAL | Status: DC
  Administered 2020-12-28: 14 mg via TRANSDERMAL
  Filled 2020-12-28 (×2): qty 1

## 2020-12-28 MED ORDER — FENTANYL CITRATE (PF) 100 MCG/2ML IJ SOLN
50.0000 ug | Freq: Once | INTRAMUSCULAR | Status: AC
  Administered 2020-12-28: 50 ug via INTRAVENOUS
  Filled 2020-12-28: qty 2

## 2020-12-28 NOTE — Progress Notes (Signed)
Pt noted to sweating and warm to touch. Face also red. Stated it began after receiving pain medicine. Temp 98.3. MD notified. Med discontinued.

## 2020-12-28 NOTE — ED Notes (Signed)
Pt taken to c-t after I asked that they wait until the ivs wer down to one bag and less likely to run in over in xray  Taken now anyway

## 2020-12-28 NOTE — ED Provider Notes (Signed)
[facility] EMERGENCY DEPARTMENT Provider Note   CSN: [id number] Arrival date & time: 12/27/20  2257     History Chief Complaint  Patient presents with  . Sore Throat    Austin Zuniga is a 30 y.o. male.  Patient presents to the emergency department with a chief complaint of sore throat.  He was seen 2 days ago for the same.  Was given clindamycin, and instructed to follow-up with ENT.  Had CT 2 days ago, but now says that he can't tolerate his own secretions, drink water, or swallow his pills.  He reports associated fever.  States that his urine is dark.  The history is provided by the patient. No language interpreter was used.       Past Medical History:  Diagnosis Date  . Anxiety 2017   on lorazepam 1 mg tid   . Irregular heart beat 2017    Patient Active Problem List   Diagnosis Date Noted  . Anxiety 03/14/2016  . Smoking 03/14/2016  . Esophageal reflux 03/14/2016    Past Surgical History:  Procedure Laterality Date  . none         Family History  Problem Relation Age of Onset  . Birth defects Mother   . Diabetes Mother   . Hypertension Mother   . Kidney disease Mother   . Diabetes Maternal Grandfather   . Hypertension Maternal Grandfather     Social History   Tobacco Use  . Smoking status: Current Every Day Smoker    Packs/day: 0.50    Types: Cigarettes  . Smokeless tobacco: Never Used  Substance Use Topics  . Alcohol use: No    Alcohol/week: 0.0 standard drinks    Comment: social  . Drug use: No    Frequency: 5.0 times per week    Home Medications Prior to Admission medications   Medication Sig Start Date End Date Taking? Authorizing Provider  buPROPion (WELLBUTRIN SR) 100 MG 12 hr tablet TAKE 1 TABLET BY MOUTH TWICE DAILY. Patient not taking: Reported on 12/26/2020 03/14/16   Almon Hercules, MD  clindamycin (CLEOCIN) 150 MG capsule Take 3 capsules (450 mg total) by mouth 3 (three) times daily for 7 days. 12/26/20 01/02/21   Lorelee New, PA-C  ibuprofen (ADVIL) 600 MG tablet Take 1 tablet (600 mg total) by mouth every 6 (six) hours as needed. 12/26/20   Lorelee New, PA-C  LORazepam (ATIVAN) 1 MG tablet Take 1 tablet (1 mg total) by mouth 3 (three) times daily as needed for anxiety. Patient not taking: Reported on 12/26/2020 03/13/16   Almon Hercules, MD  pantoprazole (PROTONIX) 40 MG tablet Take 1 tablet (40 mg total) by mouth daily. Patient not taking: Reported on 12/26/2020 03/13/16   Almon Hercules, MD    Allergies    Patient has no known allergies.  Review of Systems   Review of Systems  All other systems reviewed and are negative.   Physical Exam Updated Vital Signs BP 123/77 (BP Location: Left Arm)   Pulse (!) 120   Temp 99.9 F (37.7 C) (Oral)   Resp (!) 22   SpO2 99%   Physical Exam Vitals and nursing note reviewed.  Constitutional:      Appearance: He is well-developed.  HENT:     Head: Normocephalic and atraumatic.     Mouth/Throat:     Pharynx: Oropharyngeal exudate and posterior oropharyngeal erythema present.     Comments: Significant edema, no stridor Eyes:  Conjunctiva/sclera: Conjunctivae normal.  Cardiovascular:     Rate and Rhythm: Regular rhythm. Tachycardia present.     Heart sounds: No murmur heard.   Pulmonary:     Effort: Pulmonary effort is normal. No respiratory distress.     Breath sounds: Normal breath sounds.  Abdominal:     Palpations: Abdomen is soft.     Tenderness: There is no abdominal tenderness.  Musculoskeletal:     Cervical back: Neck supple.  Skin:    General: Skin is warm and dry.  Neurological:     Mental Status: He is alert and oriented to person, place, and time.  Psychiatric:        Mood and Affect: Mood normal.        Behavior: Behavior normal.     ED Results / Procedures / Treatments   Labs (all labs ordered are listed, but only abnormal results are displayed) Labs Reviewed  RESP PANEL BY RT-PCR (FLU A&B, COVID) ARPGX2   CULTURE, BLOOD (ROUTINE X 2)  CULTURE, BLOOD (ROUTINE X 2)  URINE CULTURE  LACTIC ACID, PLASMA  LACTIC ACID, PLASMA  COMPREHENSIVE METABOLIC PANEL  CBC WITH DIFFERENTIAL/PLATELET  PROTIME-INR  APTT  URINALYSIS, ROUTINE W REFLEX MICROSCOPIC    EKG None  Radiology CT Soft Tissue Neck W Contrast  Result Date: 12/26/2020 CLINICAL DATA:  Right ear and neck pain over the last 3 days. Question abscess. EXAM: CT NECK WITH CONTRAST TECHNIQUE: Multidetector CT imaging of the neck was performed using the standard protocol following the bolus administration of intravenous contrast. CONTRAST:  20mL OMNIPAQUE IOHEXOL 300 MG/ML  SOLN COMPARISON:  None. FINDINGS: Pharynx and larynx: Advanced right tonsillitis with considerable inflammation of the parapharyngeal space extending from the level of the tonsils down as far as the larynx. Mass-effect upon the right side of the oropharynx and hypopharynx. Phlegmonous inflammation throughout this region, possibly with small actual peritonsillar abscess formation. Difficult to see a discrete well-marginated abscess. The left tonsil is prominent as well, but without evidence of advanced inflammatory change or abscess. Salivary glands: Parotid and submandibular glands are normal. Thyroid: Normal Lymph nodes: No asymmetric nodal enlargement or suppuration. Vascular: Normal Limited intracranial: Normal Visualized orbits: Normal Mastoids and visualized paranasal sinuses: Clear Skeleton: Normal Upper chest: Negative Other: None IMPRESSION: Advanced right tonsillitis with considerable inflammation of the right parapharyngeal space extending from the level of the tonsils down as far as the larynx. Mass-effect upon the right side of the oropharynx and hypopharynx. Difficult to see a discrete well-marginated abscess, but because of the considerable phlegmonous inflammation, small abscesses could be hidden. The left tonsil is prominent as well, but without evidence of advanced  inflammatory change or abscess. Electronically Signed   By: Paulina Fusi M.D.   On: 12/26/2020 08:55    Procedures .Critical Care Performed by: Roxy Horseman, PA-C Authorized by: Roxy Horseman, PA-C   Critical care provider statement:    Critical care time (minutes):  55   Critical care was necessary to treat or prevent imminent or life-threatening deterioration of the following conditions:  Sepsis   Critical care was time spent personally by me on the following activities:  Discussions with consultants, evaluation of patient's response to treatment, examination of patient, ordering and performing treatments and interventions, ordering and review of laboratory studies, ordering and review of radiographic studies, pulse oximetry, re-evaluation of patient's condition, obtaining history from patient or surrogate and review of old charts     Medications Ordered in ED Medications  lactated ringers infusion (  has no administration in time range)  lactated ringers bolus 1,000 mL (has no administration in time range)    And  lactated ringers bolus 1,000 mL (has no administration in time range)    And  lactated ringers bolus 1,000 mL (has no administration in time range)    And  lactated ringers bolus 500 mL (has no administration in time range)  clindamycin (CLEOCIN) IVPB 900 mg (has no administration in time range)  dexamethasone (DECADRON) injection 10 mg (has no administration in time range)  fentaNYL (SUBLIMAZE) injection 100 mcg (has no administration in time range)    ED Course  I have reviewed the triage vital signs and the nursing notes.  Pertinent labs & imaging results that were available during my care of the patient were reviewed by me and considered in my medical decision making (see chart for details).    MDM Rules/Calculators/A&P                          This patient complains of sore throat, this involves an extensive number of treatment options, and is a complaint  that carries with it a high risk of complications and morbidity.    Patient was seen 2 days ago for the same.  Reports worsening symptoms.    3:36 AM CT informs me in delay in imaging due to patient getting fluids.  Lactic is 1.3, he is not hypotensive. I asked CT to come and get the patient for the CT.  Will complete fluids once he returns.  RN notified.  Differential Dx Peritonsillar abscess, retropharyngeal abscess, sepsis, tonsillar abscess  Pertinent Labs I ordered, reviewed, and interpreted labs, which included no significant leukocytosis today, this is improved from 2 days ago.  Lactic is 1.3, no significant electrolyte derangement, Covid negative.  Imaging Interpretation I ordered imaging studies which included CT soft tissue neck shows progression from recent imaging, now consistent with supraglottitis, tonsillitis, and possibly developing abscess.   Medications I ordered medication clindamycin, Decadron, fluids for SIRS and tonsillitis.  Sources Previous records obtained and reviewed and patient was seen recently for the same.  Reassessments Patient reassessed.  No airway compromise.  Appears stable for admission.  Consultants Case discussed with Dr. Annalee Genta, advises no surgical intervention at this time, but recommends continuing with fluids and antibiotics.  States that if patient does not have any improvement in the next 24 hours, then ENT should be reconsulted.  Case discussed with Dr. Leafy Half, who is appreciated for admitting.  Plan Admit    Final Clinical Impression(s) / ED Diagnoses Final diagnoses:  Tonsillitis    Rx / DC Orders ED Discharge Orders    None       Ronne, Stefanski, PA-C 12/28/20 0449    Geoffery Lyons, MD 12/28/20 [phone]

## 2020-12-28 NOTE — H&P (Addendum)
History and Physical    Austin Zuniga ZOX:[id number] DOB: 03/10/1991 DOA: 12/27/2020  PCP: Patient, No Pcp Per  Patient coming from: Home   Chief Complaint:  Chief Complaint  Patient presents with  . Sore Throat     HPI:    30 year old male with past medical history of gastroesophageal reflux disease and nicotine dependence who Modena Jansky to [facility] emergency department with complaints of throat pain.  Patient explains that approximately 4 days ago he began to develop an itchy scratchy throat.  Within 24 hours this became extremely painful.  Patient describes the pain is located in the right side of the neck, becoming throbbing in quality and severe in intensity.  Pain is nonradiating  and worse with movement.  Within 48 hours of onset of symptoms patient also began to develop associated recurrent bouts of fever.  As patient symptoms progressively worsen patient also began to develop difficulty with swallowing, initially with solids and then with liquids as well.  Patient states that he has had virtually no oral intake in approximately 4 days.  Patient states that as a result he has become weak with very little urine output.  Patient presented to [facility] emergency department on 3/22 where a CT scan was done that identified no definitive abscess.  Patient was discharged home on clindamycin and ibuprofen.  In the days that followed patient's pain rapidly worsened.  Patient was unable to swallow the ibuprofen or clindamycin due to his inability to tolerate solids.  Due to progressively worsening symptoms the patient eventually presented to [facility] emergency room for evaluation.  Upon evaluation in the emergency department patient was noted to exhibit fevers and tachycardia.  Repeat CT imaging of the soft tissues of the neck revealed evidence of right tonsillitis and pharyngitis with associated supraglottitis with abscess measuring 1.8 x 1.5 x 1.9 cm at the area of the  right palatine tonsil.  ER provider discussed the case with Dr. Annalee Genta with ENT who after reviewing the case recommended hospitalization the hospital service, initiation of broad-spectrum intravenous antibiotics and monitoring the patient closely for 24 to 48 hours followed by a reassessment.  The hospitalist group was then called to assess the patient for admission the hospital.  Review of Systems:   Review of Systems  HENT: Positive for ear pain and sore throat.   Respiratory: Positive for shortness of breath.   Neurological: Positive for weakness.  All other systems reviewed and are negative.   Past Medical History:  Diagnosis Date  . Anxiety 2017   on lorazepam 1 mg tid   . Irregular heart beat 2017    Past Surgical History:  Procedure Laterality Date  . none       reports that he has been smoking cigarettes. He has been smoking about 0.50 packs per day. He has never used smokeless tobacco. He reports that he does not drink alcohol and does not use drugs.  No Known Allergies  Family History  Problem Relation Age of Onset  . Birth defects Mother   . Diabetes Mother   . Hypertension Mother   . Kidney disease Mother   . Diabetes Maternal Grandfather   . Hypertension Maternal Grandfather      Prior to Admission medications   Medication Sig Start Date End Date Taking? Authorizing Provider  clindamycin (CLEOCIN) 150 MG capsule Take 3 capsules (450 mg total) by mouth 3 (three) times daily for 7 days. 12/26/20 01/02/21 Yes Lorelee New, PA-C  ibuprofen (  ADVIL) 600 MG tablet Take 1 tablet (600 mg total) by mouth every 6 (six) hours as needed. 12/26/20  Yes Lorelee NewGreen, Garrett L, PA-C  buPROPion (WELLBUTRIN SR) 100 MG 12 hr tablet TAKE 1 TABLET BY MOUTH TWICE DAILY. 03/14/16   Almon HerculesGonfa, Taye T, MD  LORazepam (ATIVAN) 1 MG tablet Take 1 tablet (1 mg total) by mouth 3 (three) times daily as needed for anxiety. 03/13/16   Almon HerculesGonfa, Taye T, MD  pantoprazole (PROTONIX) 40 MG tablet Take 1  tablet (40 mg total) by mouth daily. 03/13/16   Almon HerculesGonfa, Taye T, MD    Physical Exam: Vitals:   12/28/20 0330 12/28/20 0345 12/28/20 0352 12/28/20 0400  BP: (!) 141/78 123/72  129/78  Pulse: (!) 117 (!) 120  (!) 110  Resp: (!) 21 (!) 21  15  Temp:   (!) 102.7 F (39.3 C)   TempSrc:      SpO2: 92% 94%  94%     Constitutional: Awake alert and oriented x3, patient is in distress due to pain.   Skin: no rashes, no lesions, poor skin turgor noted.  Numerous tattoos noted. Eyes: Pupils are equally reactive to light.  No evidence of scleral icterus or conjunctival pallor.  ENMT: Extremely edematous posterior oropharynx with enlarged tonsils and notable exudate.  dry mucous membranes noted.   Neck: Exquisite tenderness of the right side of the neck with notable fullness and shoddy lymphadenopathy.  Neck is supple, no evidence of jugular venous distension.   Respiratory: clear to auscultation bilaterally, no wheezing, no crackles. Normal respiratory effort. No accessory muscle use.  Cardiovascular: Tachycardic rate with regular rhythm, no murmurs / rubs / gallops. No extremity edema. 2+ pedal pulses. No carotid bruits.  Chest:   Nontender without crepitus or deformity.   Back:   Nontender without crepitus or deformity. Abdomen: Abdomen is soft and nontender.  No evidence of intra-abdominal masses.  Positive bowel sounds noted in all quadrants.   Musculoskeletal: No joint deformity upper and lower extremities. Good ROM, no contractures. Normal muscle tone.  Neurologic: CN 2-12 grossly intact. Sensation intact.  Patient moving all 4 extremities spontaneously.  Patient is following all commands.  Patient is responsive to verbal stimuli.   Psychiatric: Patient exhibits normal mood with appropriate affect.  Patient seems to possess insight as to their current situation.     Labs on Admission: I have personally reviewed following labs and imaging studies -   CBC: Recent Labs  Lab 12/26/20 0722  12/28/20 0116  WBC 16.9* 9.5  NEUTROABS 13.7* 7.7  HGB 16.3 15.0  HCT 47.8 43.3  MCV 90.0 90.4  PLT 259 215   Basic Metabolic Panel: Recent Labs  Lab 12/26/20 0722 12/28/20 0116  NA 138 138  K 3.7 3.5  CL 103 104  CO2 26 25  GLUCOSE 114* 110*  BUN 13 21*  CREATININE 0.92 1.06  CALCIUM 9.3 9.2   GFR: Estimated Creatinine Clearance: 135.5 mL/min (by C-G formula based on SCr of 1.06 mg/dL). Liver Function Tests: Recent Labs  Lab 12/28/20 0116  AST 28  ALT 40  ALKPHOS 68  BILITOT 1.0  PROT 7.8  ALBUMIN 3.6   No results for input(s): LIPASE, AMYLASE in the last 168 hours. No results for input(s): AMMONIA in the last 168 hours. Coagulation Profile: Recent Labs  Lab 12/28/20 0116  INR 1.2   Cardiac Enzymes: No results for input(s): CKTOTAL, CKMB, CKMBINDEX, TROPONINI in the last 168 hours. BNP (last 3 results) No results for  input(s): PROBNP in the last 8760 hours. HbA1C: No results for input(s): HGBA1C in the last 72 hours. CBG: No results for input(s): GLUCAP in the last 168 hours. Lipid Profile: No results for input(s): CHOL, HDL, LDLCALC, TRIG, CHOLHDL, LDLDIRECT in the last 72 hours. Thyroid Function Tests: No results for input(s): TSH, T4TOTAL, FREET4, T3FREE, THYROIDAB in the last 72 hours. Anemia Panel: No results for input(s): VITAMINB12, FOLATE, FERRITIN, TIBC, IRON, RETICCTPCT in the last 72 hours. Urine analysis:    Component Value Date/Time   COLORURINE AMBER (A) 06/12/2013 0159   APPEARANCEUR CLEAR 06/12/2013 0159   LABSPEC 1.031 (H) 06/12/2013 0159   PHURINE 5.5 06/12/2013 0159   GLUCOSEU NEGATIVE 06/12/2013 0159   HGBUR NEGATIVE 06/12/2013 0159   BILIRUBINUR SMALL (A) 06/12/2013 0159   KETONESUR NEGATIVE 06/12/2013 0159   PROTEINUR NEGATIVE 06/12/2013 0159   UROBILINOGEN 1.0 06/12/2013 0159   NITRITE NEGATIVE 06/12/2013 0159   LEUKOCYTESUR NEGATIVE 06/12/2013 0159    Radiological Exams on Admission - Personally Reviewed: CT Soft  Tissue Neck W Contrast  Result Date: 12/28/2020 CLINICAL DATA:  Initial evaluation for acute sore throat, right-side. EXAM: CT NECK WITH CONTRAST TECHNIQUE: Multidetector CT imaging of the neck was performed using the standard protocol following the bolus administration of intravenous contrast. CONTRAST:  64mL OMNIPAQUE IOHEXOL 300 MG/ML  SOLN COMPARISON:  Recent CT from 12/26/2020 FINDINGS: Pharynx and larynx: Continued evidence of acute right-sided tonsillitis with enlargement and heterogeneity of the right palatine tonsil, more prominent as compared to previous exam. Superimposed hypodensity measuring 1.8 x 1.5 x 1.9 cm suspicious for early tonsillar/peritonsillar abscess, increased in size from previous, although this remains fairly ill-defined (series 3, image 30). Extensive swelling and edematous changes throughout the adjacent right pharynx to the level of the piriform sinuses. Associated swelling and induration within the adjacent right parapharyngeal and submandibular spaces. Involvement of the right aryepiglottic folds and right aspect of the epiglottis, similar to previous, suggesting a degree of associated supraglottitis. The left tonsil is prominent as well without discrete collection. Glottis remains within normal limits inferiorly. Subglottic airway clear. Salivary glands: Salivary glands including the parotid and submandibular glands are normal. Thyroid: Normal. Lymph nodes: Prominent level II lymph nodes measure up to 1.2 cm on the right, presumably reactive. Vascular: Scattered foci of gas lucency seen within the venous system, likely related IV access. Normal intravascular enhancement seen elsewhere throughout the neck. Limited intracranial: Unremarkable. Visualized orbits: Not included on this exam. Mastoids and visualized paranasal sinuses: Mild mucosal thickening within the maxillary sinuses. Visualized paranasal sinuses are otherwise clear. Mastoid air cells and middle ear cavities are well  pneumatized and free of fluid. Skeleton: No acute finding.  No worrisome osseous lesions. Upper chest: Visualized upper chest demonstrates no acute finding. Partially visualized lungs are clear. Other: None. IMPRESSION: 1. Continued evidence of acute right-sided tonsillitis and pharyngitis. Involvement of the right epiglottis and aryepiglottic fold consistent with associated supraglottitis. 1.8 x 1.5 x 1.9 cm hypodense area at the right palatine tonsil suspicious for early abscess, although this remains fairly ill-defined and may not reflect a true abscess as of yet. 2. Mildly enlarged cervical adenopathy, presumably reactive. Electronically Signed   By: Rise Mu M.D.   On: 12/28/2020 04:08   CT Soft Tissue Neck W Contrast  Result Date: 12/26/2020 CLINICAL DATA:  Right ear and neck pain over the last 3 days. Question abscess. EXAM: CT NECK WITH CONTRAST TECHNIQUE: Multidetector CT imaging of the neck was performed using the standard protocol following  the bolus administration of intravenous contrast. CONTRAST:  31mL OMNIPAQUE IOHEXOL 300 MG/ML  SOLN COMPARISON:  None. FINDINGS: Pharynx and larynx: Advanced right tonsillitis with considerable inflammation of the parapharyngeal space extending from the level of the tonsils down as far as the larynx. Mass-effect upon the right side of the oropharynx and hypopharynx. Phlegmonous inflammation throughout this region, possibly with small actual peritonsillar abscess formation. Difficult to see a discrete well-marginated abscess. The left tonsil is prominent as well, but without evidence of advanced inflammatory change or abscess. Salivary glands: Parotid and submandibular glands are normal. Thyroid: Normal Lymph nodes: No asymmetric nodal enlargement or suppuration. Vascular: Normal Limited intracranial: Normal Visualized orbits: Normal Mastoids and visualized paranasal sinuses: Clear Skeleton: Normal Upper chest: Negative Other: None IMPRESSION: Advanced  right tonsillitis with considerable inflammation of the right parapharyngeal space extending from the level of the tonsils down as far as the larynx. Mass-effect upon the right side of the oropharynx and hypopharynx. Difficult to see a discrete well-marginated abscess, but because of the considerable phlegmonous inflammation, small abscesses could be hidden. The left tonsil is prominent as well, but without evidence of advanced inflammatory change or abscess. Electronically Signed   By: Paulina Fusi M.D.   On: 12/26/2020 08:55    EKG: Personally reviewed.  Rhythm is sinus tachycardia with heart rate of 119 bpm.  Notable T wave inversions in the inferior leads.    Assessment/Plan Principal Problem:   Sepsis (HCC)   Patient exhibiting multiple SIRS criteria including tachycardia and fever in the setting of extensive tonsillitis, pharyngitis and supraglottitis with associated abscess formation  Patient has failed outpatient treatment with oral clindamycin, primarily due to inability to tolerate any solids or medications  Exam and personal review of CT imaging reveals extensive disease  Case already discussed between ER provider and  Dr. Annalee Genta with ENT does not feel that surgical intervention is necessary at this time and recommends intravenous antibiotics for 24 to 48 hours and if no improvement to formally consult ENT.  Treating patient with broad-spectrum intravenous antibiotic therapy with Unasyn and vancomycin  Blood cultures obtained  Patient is have been hydrated aggressively with 30 cc/kg of isotonic fluids followed by sodium chloride infusion  Considering extent of  infection, obtaining HIV testing and hemoglobin A1c.  Active Problems:   Peritonsillar abscess   As noted above, extensive disease  Patient is oxygenating normally however with no evidence of stridor on examination  Clear liquid diet for now  As needed Chloraseptic for local treatment of pain  As needed  morphine for intravenous treatment of pain.    Nicotine dependence, cigarettes, uncomplicated   Counseling patient on cessation    GERD without esophagitis   Patient has been transitioned to intravenous Protonix.  Code Status:  Full code Family Communication: deferred   Status is: Inpatient  Remains inpatient appropriate because:IV treatments appropriate due to intensity of illness or inability to take PO and Inpatient level of care appropriate due to severity of illness   Dispo: The patient is from: Home              Anticipated d/c is to: Home              Patient currently is not medically stable to d/c.   Difficult to place patient No        Marinda Elk MD [facility] Pager [phone]  If 7PM-7AM, please contact night-coverage [email or web address] Use universal [place] password for that web site. If  you do not have the password, please call the hospital operator.  12/28/2020, 5:58 AM

## 2020-12-28 NOTE — Progress Notes (Signed)
Following for Code Sepsis  

## 2020-12-28 NOTE — Progress Notes (Signed)
PROGRESS NOTE    Austin Zuniga  FGH:[id number] DOB: 26-Oct-1990 DOA: 12/27/2020 PCP: Patient, No Pcp Per    Brief Narrative:  Mr. Austin Zuniga was admitted to the hospital working diagnosis of early right peritonsillar abscess, supraglottitis (failed outpatient therapy).  30 year old male with past medical history of GERD nicotine dependence who presented with sore throat.  Reported 24 hours of worsening sore throat, more severe over the last 24 hours prior to hospitalization.  Pain localized to the right side of the neck, severe intensity, nonradiating, associated with fevers and difficulty swallowing (solids and liquids), poor oral intake. 03/22 he was diagnosed with tonsillitis in the ED at [facility], received clindamycin, ibuprofen and was discharged home.  Due to persistent pain he was unable to take p.o. medications and came back to the hospital for further evaluation.  On his initial physical examination blood pressure 141/78, heart rate 120, respirate 21, temperature 39.3 C, oxygen saturation 92%, patient had significant pain upon palpation of the right side of his neck, positive lymphadenopathy, neck supple, lungs clear to auscultation bilaterally, heart S1-S2, present, tachycardic, abdomen soft, no lower extremity edema.  Sodium 138, potassium 3.5, chloride 104, bicarb 25, glucose 110, BUN 21, creatinine 1.0, lactic acid 1.3, white count 9.5, hemoglobin 15.0, hematocrit 43.3, platelets 215. SARS COVID-19 negative. Urinalysis specific gravity 1.045  EKG had 19 bpm, normal axis, normal intervals, sinus rhythm, no significant ST segment changes, negative T wave lead II, lead III, aVF, V5-V6.   Soft tissue CT scan of the neck, acute right-sided tonsillitis and pharyngitis, involvement of the right epiglottitis and aryepiglottic fold consistent with supraglottitis.  1.8 x 1.5 x 1.9 cm hypodense area in the right buttock thin tonsil suspicious for early abscess.  Mildly enlarged cervical  adenopathy.  Assessment & Plan:   Principal Problem:   Peritonsillar abscess Active Problems:   GERD without esophagitis   Sepsis (HCC)   Nicotine dependence, cigarettes, uncomplicated   Dehydration   1. Right sided tonsillitis with pharyngitis, early peritonsillar abscess complicated with sepsis. Patient continue to have neck pain and odynophagia, no dyspnea.  Wbc is down to 9,5 from 16,9. Cultures still pending result.   Continue medical management with Unasyn, will discontinue vancomycin for now. Add antiinflammatory analgesic with scheduled ketorolac.  Follow up on cell count, temperature and cultures.  Continue hydration with isotonic saline and dextrose at 75 ml per hr.   If worsening will repeat CT soft tissue neck, may need formal ENT consultation.   2. Dehydration. Continue IV fluids with dextrose and isotonic saline at 75 ml per hr, his po intake is limited due to odynophagia. Follow up on renal function and electrolytes in am.   3. GERD. Continue IV antiacid therapy and as needed antiemetics.   4. Obesity class 1. Calculated BMI is 33.8   Patient continue to be at high risk for worsening sepsis   Status is: Inpatient  Remains inpatient appropriate because:IV treatments appropriate due to intensity of illness or inability to take PO   Dispo: The patient is from: Home              Anticipated d/c is to: Home              Patient currently is not medically stable to d/c.   Difficult to place patient No   DVT prophylaxis: Enoxaparin   Code Status:   full  Family Communication:  No family at the bedside       Antimicrobials:   Unasyn  Subjective: Patient continue to have neck pain and difficulty swallowing, moderate in intensity, worse to touch and swallowing with no radiation., no associated nausea or vomiting, no chest pain or dyspnea.   Objective: Vitals:   12/28/20 0645 12/28/20 0730 12/28/20 0941 12/28/20 1200  BP: 125/70 120/69 112/72  105/62  Pulse: 90 (!) 58  62  Resp: 16 (!) 9  14  Temp:   97.7 F (36.5 C) 97.8 F (36.6 C)  TempSrc:   Oral Oral  SpO2: 94% 95%  97%  Weight:   116.5 kg   Height:   6\' 1"  (1.854 m)     Intake/Output Summary (Last 24 hours) at 12/28/2020 1334 Last data filed at 12/28/2020 0409 Gross per 24 hour  Intake 3500 ml  Output --  Net 3500 ml   Filed Weights   12/28/20 0941  Weight: 116.5 kg    Examination:   General: deconditioned and ill looking appearing  Neurology: Awake and alert, non focal  E ENT: no pallor, no icterus, oral mucosa moist/ tender to palpation bilaterally, with positive tender lymph nodes bilaterally.  Cardiovascular: No JVD. S1-S2 present, rhythmic, no gallops, rubs, or murmurs. No lower extremity edema. Pulmonary: positive breath sounds bilaterally,no wheezing ora rales Gastrointestinal. Abdomen soft and non tender Skin. No rashes Musculoskeletal: no joint deformities     Data Reviewed: I have personally reviewed following labs and imaging studies  CBC: Recent Labs  Lab 12/26/20 0722 12/28/20 0116  WBC 16.9* 9.5  NEUTROABS 13.7* 7.7  HGB 16.3 15.0  HCT 47.8 43.3  MCV 90.0 90.4  PLT 259 215   Basic Metabolic Panel: Recent Labs  Lab 12/26/20 0722 12/28/20 0116  NA 138 138  K 3.7 3.5  CL 103 104  CO2 26 25  GLUCOSE 114* 110*  BUN 13 21*  CREATININE 0.92 1.06  CALCIUM 9.3 9.2   GFR: Estimated Creatinine Clearance: 139.9 mL/min (by C-G formula based on SCr of 1.06 mg/dL). Liver Function Tests: Recent Labs  Lab 12/28/20 0116  AST 28  ALT 40  ALKPHOS 68  BILITOT 1.0  PROT 7.8  ALBUMIN 3.6   No results for input(s): LIPASE, AMYLASE in the last 168 hours. No results for input(s): AMMONIA in the last 168 hours. Coagulation Profile: Recent Labs  Lab 12/28/20 0116  INR 1.2   Cardiac Enzymes: No results for input(s): CKTOTAL, CKMB, CKMBINDEX, TROPONINI in the last 168 hours. BNP (last 3 results) No results for input(s): PROBNP  in the last 8760 hours. HbA1C: Recent Labs    12/28/20 0116  HGBA1C 5.2   CBG: No results for input(s): GLUCAP in the last 168 hours. Lipid Profile: No results for input(s): CHOL, HDL, LDLCALC, TRIG, CHOLHDL, LDLDIRECT in the last 72 hours. Thyroid Function Tests: No results for input(s): TSH, T4TOTAL, FREET4, T3FREE, THYROIDAB in the last 72 hours. Anemia Panel: No results for input(s): VITAMINB12, FOLATE, FERRITIN, TIBC, IRON, RETICCTPCT in the last 72 hours.    Radiology Studies: I have reviewed all of the imaging during this hospital visit personally     Scheduled Meds:  enoxaparin (LOVENOX) injection  40 mg Subcutaneous Daily   pantoprazole (PROTONIX) IV  40 mg Intravenous Daily   Continuous Infusions:  sodium chloride 125 mL/hr at 12/28/20 0648   ampicillin-sulbactam (UNASYN) IV 3 g (12/28/20 1307)   vancomycin       LOS: 0 days        Hillari Zumwalt 12/30/20, MD

## 2020-12-28 NOTE — ED Notes (Signed)
The pt reports that he has had a sorethroat for 4 days with a temp  He last had tylenol yesterday because he could not swallow the meds

## 2020-12-28 NOTE — ED Notes (Signed)
Attempted to give report to 2W 

## 2020-12-28 NOTE — Plan of Care (Signed)

## 2020-12-28 NOTE — Progress Notes (Signed)
Pharmacy Antibiotic Note  [place] Blue is a 30 y.o. male admitted on 12/27/2020 with peritonsillar abscess, possible sepsis.  Pharmacy has been consulted for Vancomycin and Unasyn dosing.  Plan: Vancomycin 2000 mg IV now, then 1250 mg IV q8h Unasyn 3 g IV q6h    Temp (24hrs), Avg:101.9 F (38.8 C), Min:99.9 F (37.7 C), Max:103.2 F (39.6 C)  Recent Labs  Lab 12/26/20 0722 12/28/20 0116  WBC 16.9* 9.5  CREATININE 0.92 1.06  LATICACIDVEN  --  1.3    Estimated Creatinine Clearance: 135.5 mL/min (by C-G formula based on SCr of 1.06 mg/dL).    No Known Allergies  Eddie Candle 12/28/2020 4:55 AM

## 2020-12-28 NOTE — ED Notes (Signed)
lr stopped nss in place of lr

## 2020-12-29 DIAGNOSIS — J36 Peritonsillar abscess: Secondary | ICD-10-CM | POA: Diagnosis not present

## 2020-12-29 DIAGNOSIS — E860 Dehydration: Secondary | ICD-10-CM | POA: Diagnosis not present

## 2020-12-29 DIAGNOSIS — F17210 Nicotine dependence, cigarettes, uncomplicated: Secondary | ICD-10-CM | POA: Diagnosis not present

## 2020-12-29 DIAGNOSIS — K219 Gastro-esophageal reflux disease without esophagitis: Secondary | ICD-10-CM | POA: Diagnosis not present

## 2020-12-29 LAB — CBC WITH DIFFERENTIAL/PLATELET
Abs Immature Granulocytes: 0.04 10*3/uL (ref 0.00–0.07)
Basophils Absolute: 0 10*3/uL (ref 0.0–0.1)
Basophils Relative: 0 %
Eosinophils Absolute: 0 10*3/uL (ref 0.0–0.5)
Eosinophils Relative: 0 %
HCT: 42.6 % (ref 39.0–52.0)
Hemoglobin: 14.5 g/dL (ref 13.0–17.0)
Immature Granulocytes: 0 %
Lymphocytes Relative: 18 %
Lymphs Abs: 1.9 10*3/uL (ref 0.7–4.0)
MCH: 31 pg (ref 26.0–34.0)
MCHC: 34 g/dL (ref 30.0–36.0)
MCV: 91.2 fL (ref 80.0–100.0)
Monocytes Absolute: 1.4 10*3/uL — ABNORMAL HIGH (ref 0.1–1.0)
Monocytes Relative: 13 %
Neutro Abs: 7.5 10*3/uL (ref 1.7–7.7)
Neutrophils Relative %: 69 %
Platelets: 204 10*3/uL (ref 150–400)
RBC: 4.67 MIL/uL (ref 4.22–5.81)
RDW: 13.4 % (ref 11.5–15.5)
WBC: 10.9 10*3/uL — ABNORMAL HIGH (ref 4.0–10.5)
nRBC: 0 % (ref 0.0–0.2)

## 2020-12-29 LAB — COMPREHENSIVE METABOLIC PANEL
ALT: 30 U/L (ref 0–44)
AST: 17 U/L (ref 15–41)
Albumin: 3 g/dL — ABNORMAL LOW (ref 3.5–5.0)
Alkaline Phosphatase: 63 U/L (ref 38–126)
Anion gap: 10 (ref 5–15)
BUN: 14 mg/dL (ref 6–20)
CO2: 26 mmol/L (ref 22–32)
Calcium: 8.8 mg/dL — ABNORMAL LOW (ref 8.9–10.3)
Chloride: 102 mmol/L (ref 98–111)
Creatinine, Ser: 0.73 mg/dL (ref 0.61–1.24)
GFR, Estimated: >60 mL/min (ref >60)
Glucose, Bld: 96 mg/dL (ref 70–99)
Potassium: 3.5 mmol/L (ref 3.5–5.1)
Sodium: 138 mmol/L (ref 135–145)
Total Bilirubin: 0.6 mg/dL (ref 0.3–1.2)
Total Protein: 6.7 g/dL (ref 6.5–8.1)

## 2020-12-29 MED ORDER — HYDROMORPHONE HCL 1 MG/ML IJ SOLN
1.0000 mg | INTRAMUSCULAR | Status: DC | PRN
  Administered 2020-12-29: 1 mg via INTRAVENOUS
  Filled 2020-12-29: qty 1

## 2020-12-29 MED ORDER — HYDROMORPHONE HCL 1 MG/ML IJ SOLN
2.0000 mg | INTRAMUSCULAR | Status: DC | PRN
  Administered 2020-12-29: 2 mg via INTRAVENOUS
  Filled 2020-12-29: qty 2

## 2020-12-29 MED ORDER — HYDROMORPHONE HCL 1 MG/ML IJ SOLN
1.0000 mg | Freq: Once | INTRAMUSCULAR | Status: AC
Start: 2020-12-29 — End: 2020-12-29
  Administered 2020-12-29: 1 mg via INTRAVENOUS
  Filled 2020-12-29: qty 1

## 2020-12-29 MED ORDER — OXYCODONE HCL 5 MG PO TABS: 5.0000 mg | ORAL_TABLET | ORAL | Status: DC | PRN

## 2020-12-29 MED ORDER — OXYCODONE HCL 5 MG PO TABS
5.0000 mg | ORAL_TABLET | ORAL | Status: DC | PRN
  Administered 2020-12-31 (×2): 5 mg via ORAL
  Filled 2020-12-29 (×3): qty 1

## 2020-12-29 MED ORDER — HYDROMORPHONE HCL 1 MG/ML IJ SOLN
1.0000 mg | INTRAMUSCULAR | Status: DC | PRN
  Administered 2020-12-29 – 2020-12-31 (×15): 1 mg via INTRAVENOUS
  Filled 2020-12-29 (×15): qty 1

## 2020-12-29 MED ORDER — HYDROMORPHONE HCL 1 MG/ML IJ SOLN: 1.0000 mg | INTRAMUSCULAR | Status: DC | PRN

## 2020-12-29 NOTE — Progress Notes (Signed)
PROGRESS NOTE    Austin Zuniga  HUT:[id number] DOB: [birth year] DOA: 12/27/2020 PCP: Patient, No Pcp Per    Brief Narrative:  Austin Zuniga was admitted to the hospital working diagnosis of early right peritonsillar abscess, supraglottitis (failed outpatient therapy).  30 year old male with past medical history of GERD nicotine dependence who presented with sore throat.  Reported 24 hours of worsening sore throat, more severe over the last 24 hours prior to hospitalization.  Pain localized to the right side of the neck, severe intensity, nonradiating, associated with fevers and difficulty swallowing (solids and liquids), poor oral intake. 03/22 he was diagnosed with tonsillitis in the ED at [facility], received clindamycin, ibuprofen and was discharged home.  Due to persistent pain he was unable to take p.o. medications and came back to the hospital for further evaluation.  On his initial physical examination blood pressure 141/78, heart rate 120, respirate 21, temperature 39.3 C, oxygen saturation 92%, patient had significant pain upon palpation of the right side of his neck, positive lymphadenopathy, neck supple, lungs clear to auscultation bilaterally, heart S1-S2, present, tachycardic, abdomen soft, no lower extremity edema.  Sodium 138, potassium 3.5, chloride 104, bicarb 25, glucose 110, BUN 21, creatinine 1.0, lactic acid 1.3, white count 9.5, hemoglobin 15.0, hematocrit 43.3, platelets 215. SARS COVID-19 negative. Urinalysis specific gravity 1.045  EKG had 19 bpm, normal axis, normal intervals, sinus rhythm, no significant ST segment changes, negative T wave lead II, lead III, aVF, V5-V6.   Soft tissue CT scan of the neck, acute right-sided tonsillitis and pharyngitis, involvement of the right epiglottitis and aryepiglottic fold consistent with supraglottitis.  1.8 x 1.5 x 1.9 cm hypodense area in the right buttock thin tonsil suspicious for early abscess.  Mildly enlarged cervical  adenopathy.  Patient continue to have significant throat pain and odynophagia.    Assessment & Plan:   Principal Problem:   Peritonsillar abscess Active Problems:   GERD without esophagitis   Sepsis (HCC)   Nicotine dependence, cigarettes, uncomplicated   Dehydration    1. Right sided tonsillitis with pharyngitis, early peritonsillar abscess complicated with sepsis (present on admission).  Persistent neck pain and odynophagia, no nausea or vomiting. No dyspnea Wbc today is 10,9, he has been afebrile. Blood cultures with no growth, urine culture positive for E coli.   Antibiotic therapy with IV Unasyn, patient not able to take oral medications due to odynophagia.   Continue supportive medical care with IV fluids, dextrose and saline at 75 ml per hr. Patient did not tolerate IV ketorolac due to facial redness.  Will increase hydromorphone to 2 mg Iv q 2 H as needed.   If persistent pain will consider adding antiinflammatory therapy with systemic steroids.   2. Dehydration. On IV fluids with dextrose and isotonic saline at 75 ml per hr. Stable renal function with serum cr at 0,73 with K at 3,5 and bicarbonate at 26. Follow up on renal function in am.   3. GERD. As needed antiemetics, continue antiacids and clear liquid diet for now.    4. Obesity class 1. Calculated BMI is 33.8    Patient continue to be at high risk for worsening sepsis   Status is: Inpatient  Remains inpatient appropriate because:IV treatments appropriate due to intensity of illness or inability to take PO   Dispo: The patient is from: Home              Anticipated d/c is to: Home  Patient currently is not medically stable to d/c.   Difficult to place patient No   DVT prophylaxis: Enoxaparin   Code Status:   full  Family Communication:  I spoke with patient's stepmother  at the bedside, we talked in detail about patient's condition, plan of care and prognosis and all questions  were addressed.     Antimicrobials:   Unasyn IV     Subjective: Patient continue to have pain at the throat, worse with swallowing, 10/10 in intensity with no radiation, mild improvement with analgesics, associated with poor oral intake,   Objective: Vitals:   12/28/20 2254 12/29/20 0351 12/29/20 0755 12/29/20 1220  BP: 115/70 117/83 (!) 149/86 126/74  Pulse: 72 72 79 85  Resp: 16 16 17 15   Temp: 98.4 F (36.9 C) 98.2 F (36.8 C) 97.9 F (36.6 C) 98.2 F (36.8 C)  TempSrc: Oral Oral Oral Oral  SpO2: 100% 99% 98% 96%  Weight:      Height:        Intake/Output Summary (Last 24 hours) at 12/29/2020 1259 Last data filed at 12/29/2020 0300 Gross per 24 hour  Intake 2170.83 ml  Output 1900 ml  Net 270.83 ml   Filed Weights   12/28/20 0941  Weight: 116.5 kg    Examination:   General: Not in dyspnea, deconditioned and in pain,  Neurology: Awake and alert, non focal  E ENT: no pallor, no icterus, oral mucosa moist, neck with no edema, but tender to palpation, positive tender submandibular lymph nodes.  Cardiovascular: No JVD. S1-S2 present, rhythmic, no gallops, rubs, or murmurs. No lower extremity edema. Pulmonary: positive breath sounds bilaterally, adequate air movement, no wheezing, rhonchi or rales. Gastrointestinal. Abdomen soft and non tender Skin. No rashes Musculoskeletal: no joint deformities     Data Reviewed: I have personally reviewed following labs and imaging studies  CBC: Recent Labs  Lab 12/26/20 0722 12/28/20 0116 12/29/20 0225  WBC 16.9* 9.5 10.9*  NEUTROABS 13.7* 7.7 7.5  HGB 16.3 15.0 14.5  HCT 47.8 43.3 42.6  MCV 90.0 90.4 91.2  PLT 259 215 204   Basic Metabolic Panel: Recent Labs  Lab 12/26/20 0722 12/28/20 0116 12/29/20 0225  NA 138 138 138  K 3.7 3.5 3.5  CL 103 104 102  CO2 26 25 26   GLUCOSE 114* 110* 96  BUN 13 21* 14  CREATININE 0.92 1.06 0.73  CALCIUM 9.3 9.2 8.8*   GFR: Estimated Creatinine Clearance: 185.4  mL/min (by C-G formula based on SCr of 0.73 mg/dL). Liver Function Tests: Recent Labs  Lab 12/28/20 0116 12/29/20 0225  AST 28 17  ALT 40 30  ALKPHOS 68 63  BILITOT 1.0 0.6  PROT 7.8 6.7  ALBUMIN 3.6 3.0*   No results for input(s): LIPASE, AMYLASE in the last 168 hours. No results for input(s): AMMONIA in the last 168 hours. Coagulation Profile: Recent Labs  Lab 12/28/20 0116  INR 1.2   Cardiac Enzymes: No results for input(s): CKTOTAL, CKMB, CKMBINDEX, TROPONINI in the last 168 hours. BNP (last 3 results) No results for input(s): PROBNP in the last 8760 hours. HbA1C: Recent Labs    12/28/20 0116  HGBA1C 5.2   CBG: No results for input(s): GLUCAP in the last 168 hours. Lipid Profile: No results for input(s): CHOL, HDL, LDLCALC, TRIG, CHOLHDL, LDLDIRECT in the last 72 hours. Thyroid Function Tests: No results for input(s): TSH, T4TOTAL, FREET4, T3FREE, THYROIDAB in the last 72 hours. Anemia Panel: No results for input(s): VITAMINB12, FOLATE,  FERRITIN, TIBC, IRON, RETICCTPCT in the last 72 hours.    Radiology Studies: I have reviewed all of the imaging during this hospital visit personally     Scheduled Meds: . enoxaparin (LOVENOX) injection  40 mg Subcutaneous Daily  . nicotine  14 mg Transdermal Daily  . pantoprazole (PROTONIX) IV  40 mg Intravenous Daily   Continuous Infusions: . ampicillin-sulbactam (UNASYN) IV 3 g (12/29/20 1204)  . dextrose 5 % and 0.9% NaCl 75 mL/hr at 12/29/20 0528     LOS: 1 day        Jayson Waterhouse Annett Gula, MD

## 2020-12-29 NOTE — Progress Notes (Signed)
Noticed HR dipping to 45.  Sats about 88%.  Patient awake and alert.  Stated he was about to fall asleep a minute ago.  Called tele and they noticed a 2.1 second pause as well as possibly a high level block. MD notified and orders received. Patient talking on telephone. States he thinks he has sleep apnea and holds his breath before falling asleep.

## 2020-12-29 NOTE — Progress Notes (Signed)
EKG personally reviewed  HR 51 bpm, normal axis, normal intervals, sinus with sinus pause 1.6, seconds, no ST segment changes, negative T wave inferior leads lead II, lead III and AVf.  Suspected vagal related.  Decrease dose and frequency of of hydromorphone. Continue telemetry monitoring.  Echocardiogram in am.

## 2020-12-30 ENCOUNTER — Inpatient Hospital Stay (HOSPITAL_COMMUNITY): Payer: Medicaid Other

## 2020-12-30 DIAGNOSIS — R001 Bradycardia, unspecified: Secondary | ICD-10-CM

## 2020-12-30 DIAGNOSIS — F17210 Nicotine dependence, cigarettes, uncomplicated: Secondary | ICD-10-CM | POA: Diagnosis not present

## 2020-12-30 DIAGNOSIS — J36 Peritonsillar abscess: Secondary | ICD-10-CM | POA: Diagnosis not present

## 2020-12-30 DIAGNOSIS — E860 Dehydration: Secondary | ICD-10-CM | POA: Diagnosis not present

## 2020-12-30 DIAGNOSIS — K219 Gastro-esophageal reflux disease without esophagitis: Secondary | ICD-10-CM | POA: Diagnosis not present

## 2020-12-30 LAB — CBC WITH DIFFERENTIAL/PLATELET
Abs Immature Granulocytes: 0.05 10*3/uL (ref 0.00–0.07)
Basophils Absolute: 0 10*3/uL (ref 0.0–0.1)
Basophils Relative: 0 %
Eosinophils Absolute: 0 10*3/uL (ref 0.0–0.5)
Eosinophils Relative: 0 %
HCT: 45.8 % (ref 39.0–52.0)
Hemoglobin: 14.9 g/dL (ref 13.0–17.0)
Immature Granulocytes: 0 %
Lymphocytes Relative: 17 %
Lymphs Abs: 2 10*3/uL (ref 0.7–4.0)
MCH: 30.4 pg (ref 26.0–34.0)
MCHC: 32.5 g/dL (ref 30.0–36.0)
MCV: 93.5 fL (ref 80.0–100.0)
Monocytes Absolute: 1.3 10*3/uL — ABNORMAL HIGH (ref 0.1–1.0)
Monocytes Relative: 11 %
Neutro Abs: 8.6 10*3/uL — ABNORMAL HIGH (ref 1.7–7.7)
Neutrophils Relative %: 72 %
Platelets: 210 10*3/uL (ref 150–400)
RBC: 4.9 MIL/uL (ref 4.22–5.81)
RDW: 13.2 % (ref 11.5–15.5)
WBC: 11.9 10*3/uL — ABNORMAL HIGH (ref 4.0–10.5)
nRBC: 0 % (ref 0.0–0.2)

## 2020-12-30 LAB — URINE CULTURE: Culture: 2000 — AB

## 2020-12-30 LAB — ECHOCARDIOGRAM COMPLETE
Area-P 1/2: 5.97 cm2
Height: 73 in
S' Lateral: 3.5 cm
Weight: 4109.37 oz

## 2020-12-30 LAB — BASIC METABOLIC PANEL
Anion gap: 9 (ref 5–15)
BUN: 8 mg/dL (ref 6–20)
CO2: 28 mmol/L (ref 22–32)
Calcium: 9.1 mg/dL (ref 8.9–10.3)
Chloride: 100 mmol/L (ref 98–111)
Creatinine, Ser: 0.7 mg/dL (ref 0.61–1.24)
GFR, Estimated: >60 mL/min (ref >60)
Glucose, Bld: 96 mg/dL (ref 70–99)
Potassium: 3.3 mmol/L — ABNORMAL LOW (ref 3.5–5.1)
Sodium: 137 mmol/L (ref 135–145)

## 2020-12-30 MED ORDER — METHYLPREDNISOLONE SODIUM SUCC 40 MG IJ SOLR
20.0000 mg | INTRAMUSCULAR | Status: DC
  Administered 2020-12-30 – 2021-01-01 (×3): 20 mg via INTRAVENOUS
  Filled 2020-12-30 (×3): qty 1

## 2020-12-30 MED ORDER — POTASSIUM CHLORIDE 10 MEQ/100ML IV SOLN
10.0000 meq | INTRAVENOUS | Status: AC
  Administered 2020-12-30 (×6): 10 meq via INTRAVENOUS
  Filled 2020-12-30 (×2): qty 100

## 2020-12-30 MED ORDER — POTASSIUM CHLORIDE CRYS ER 20 MEQ PO TBCR: 40.0000 meq | EXTENDED_RELEASE_TABLET | ORAL | Status: DC

## 2020-12-30 NOTE — Progress Notes (Signed)
PROGRESS NOTE    Austin Zuniga  WFU:[id number] DOB: 16-Oct-1990 DOA: 12/27/2020 PCP: Patient, No Pcp Per    Brief Narrative:  Austin Zuniga was admitted to the hospital working diagnosis ofearlyright peritonsillar abscess,supraglottitis(failed outpatient therapy).  30 year old male with past medical history of GERD nicotine dependence who presented with sore throat. Reported 24 hours of worsening sore throat, more severe over the last 24 hours prior to hospitalization. Pain localized to the right side of the neck, severe intensity, nonradiating,associated with fevers and difficulty swallowing(solids and liquids),poor oral intake. 03/22he was diagnosed with tonsillitis in the ED at [facility], received clindamycin, ibuprofen and was discharged home. Due to persistent pain he was unable to take p.o. medications and came back to the hospital for further evaluation. On his initial physical examination blood pressure 141/78, heart rate 120, respirate 21, temperature 39.3 C, oxygen saturation 92%, patient had significant pain upon palpation of the right side of his neck, positive lymphadenopathy, neck supple, lungs clear to auscultation bilaterally, heart S1-S2, present, tachycardic, abdomen soft, no lower extremity edema.  Sodium 138, potassium 3.5, chloride 104, bicarb 25, glucose 110, BUN 21, creatinine 1.0, lactic acid 1.3, white count 9.5, hemoglobin 15.0, hematocrit 43.3, platelets 215. SARS COVID-19 negative. Urinalysis specific gravity 1.045  EKG had 19 bpm, normal axis, normal intervals, sinus rhythm, no significant ST segment changes, negative T wave lead II, lead III, aVF, V5-V6.  Soft tissue CT scan of the neck, acute right-sided tonsillitis and pharyngitis, involvement of the right epiglottitis and aryepiglottic fold consistent with supraglottitis. 1.8 x 1.5 x 1.9 cm hypodense area in the right buttock thin tonsil suspicious for early abscess. Mildly enlarged cervical  adenopathy.  Patient continue to have significant throat pain and odynophagia.   He had vagal related bradycardia and 1,6 sinus pause with no symptoms and stable blood pressure.    Assessment & Plan:   Principal Problem:   Peritonsillar abscess Active Problems:   GERD without esophagitis   Sepsis (HCC)   Nicotine dependence, cigarettes, uncomplicated   Dehydration    1. Right sided tonsillitis with pharyngitis, early peritonsillar abscess complicated with sepsis (present on admission).  Continue to have pain with swallowing, not able to eat.   Wbc today is up to 11,9, he continue to be afebrile. Blood cultures with no growth,  Urine culture positive for E coli.   Continue with IV Unasyn. He has persistent pain, did not tolerated IV ketorolac and not able to take oral medication due to odynophagia. Will add low dose systemic steroids with methylprednisolone 20 mg IV for anti-inflammatory. If no improvement with steroids will repeat neck CT soft tissue.   Continue with IV dextrose and saline at 75 ml per hr. Continue pain control with hydromorphone for severe pain and will add oxycodone for moderate pain.    2. Dehydration/ hypokalemia. Continue with IV fluids with dextrose and isotonic saline at 75 ml per hr.  Renal function stable with serum cr at 0,70, K is down to 3,3 and serum bicarbonate at 28. Add Kcl 60 meq IV and follow up on renal functio in am.   3. Sinus bradycardia with sinus pauses/ suspected vagal hyperreactivity. Continue telemetry monitoring and follow up with echocardiogram. Keep K at 4 and Mg at 2.   3. GERD. Continue with IV pantoprazole and as needed antiemetic therapy.  On IV fluids.    4. Obesity class 1. Calculated BMI is 33.8, will need outpatient follow up.    Patient continue to be at high  risk for worsening tonsillitis.,   Status is: Inpatient  Remains inpatient appropriate because:IV treatments appropriate due to intensity of illness  or inability to take PO   Dispo: The patient is from: Home              Anticipated d/c is to: Home              Patient currently is not medically stable to d/c.   Difficult to place patient No   DVT prophylaxis: Enoxaparin   Code Status:   full  Family Communication:  No family at the bedside      Consultants:   ENT in the ED   Antimicrobials:   Unasyn,     Subjective: Patient continue to have throat pain and odynophagia, no nausea or vomiting, no chest pain or dyspnea.   Objective: Vitals:   12/29/20 1906 12/29/20 2236 12/30/20 0235 12/30/20 0744  BP: 124/75 119/66 (!) 141/94 135/67  Pulse: 76 100 85 75  Resp: 16 16 20 18   Temp: 97.8 F (36.6 C) 97.8 F (36.6 C) 98 F (36.7 C) 99 F (37.2 C)  TempSrc: Oral Oral Oral Oral  SpO2: 98% 97% 96% 96%  Weight:      Height:        Intake/Output Summary (Last 24 hours) at 12/30/2020 1120 Last data filed at 12/30/2020 0200 Gross per 24 hour  Intake -  Output 1900 ml  Net -1900 ml   Filed Weights   12/28/20 0941  Weight: 116.5 kg    Examination:   General:  deconditioned and in pain.,  Neurology: Awake and alert, non focal  E ENT: no pallor, no icterus, oral mucosa moist, tender to palpation at the left side of the neck.,  Cardiovascular: No JVD. S1-S2 present, rhythmic, no gallops, rubs, or murmurs. No lower extremity edema. Pulmonary: positive breath sounds bilaterally, adequate air movement, no wheezing, rhonchi or rales. Gastrointestinal. Abdomen soft and non tender Skin. No rashes Musculoskeletal: no joint deformities     Data Reviewed: I have personally reviewed following labs and imaging studies  CBC: Recent Labs  Lab 12/26/20 0722 12/28/20 0116 12/29/20 0225 12/30/20 0155  WBC 16.9* 9.5 10.9* 11.9*  NEUTROABS 13.7* 7.7 7.5 8.6*  HGB 16.3 15.0 14.5 14.9  HCT 47.8 43.3 42.6 45.8  MCV 90.0 90.4 91.2 93.5  PLT 259 215 204 210   Basic Metabolic Panel: Recent Labs  Lab 12/26/20 0722  12/28/20 0116 12/29/20 0225 12/30/20 0155  NA 138 138 138 137  K 3.7 3.5 3.5 3.3*  CL 103 104 102 100  CO2 26 25 26 28   GLUCOSE 114* 110* 96 96  BUN 13 21* 14 8  CREATININE 0.92 1.06 0.73 0.70  CALCIUM 9.3 9.2 8.8* 9.1   GFR: Estimated Creatinine Clearance: 185.4 mL/min (by C-G formula based on SCr of 0.7 mg/dL). Liver Function Tests: Recent Labs  Lab 12/28/20 0116 12/29/20 0225  AST 28 17  ALT 40 30  ALKPHOS 68 63  BILITOT 1.0 0.6  PROT 7.8 6.7  ALBUMIN 3.6 3.0*   No results for input(s): LIPASE, AMYLASE in the last 168 hours. No results for input(s): AMMONIA in the last 168 hours. Coagulation Profile: Recent Labs  Lab 12/28/20 0116  INR 1.2   Cardiac Enzymes: No results for input(s): CKTOTAL, CKMB, CKMBINDEX, TROPONINI in the last 168 hours. BNP (last 3 results) No results for input(s): PROBNP in the last 8760 hours. HbA1C: Recent Labs    12/28/20 0116  HGBA1C  5.2   CBG: No results for input(s): GLUCAP in the last 168 hours. Lipid Profile: No results for input(s): CHOL, HDL, LDLCALC, TRIG, CHOLHDL, LDLDIRECT in the last 72 hours. Thyroid Function Tests: No results for input(s): TSH, T4TOTAL, FREET4, T3FREE, THYROIDAB in the last 72 hours. Anemia Panel: No results for input(s): VITAMINB12, FOLATE, FERRITIN, TIBC, IRON, RETICCTPCT in the last 72 hours.    Radiology Studies: I have reviewed all of the imaging during this hospital visit personally     Scheduled Meds: . enoxaparin (LOVENOX) injection  40 mg Subcutaneous Daily  . nicotine  14 mg Transdermal Daily  . pantoprazole (PROTONIX) IV  40 mg Intravenous Daily   Continuous Infusions: . ampicillin-sulbactam (UNASYN) IV 3 g (12/30/20 0513)  . dextrose 5 % and 0.9% NaCl 75 mL/hr at 12/30/20 0514     LOS: 2 days        Mauricio Annett Gula, MD

## 2020-12-30 NOTE — Progress Notes (Signed)
  Echocardiogram 2D Echocardiogram with 3D and strain has been performed.  Leta Jungling M 12/30/2020, 11:05 AM

## 2020-12-31 DIAGNOSIS — J36 Peritonsillar abscess: Secondary | ICD-10-CM | POA: Diagnosis not present

## 2020-12-31 DIAGNOSIS — E860 Dehydration: Secondary | ICD-10-CM | POA: Diagnosis not present

## 2020-12-31 DIAGNOSIS — F17210 Nicotine dependence, cigarettes, uncomplicated: Secondary | ICD-10-CM | POA: Diagnosis not present

## 2020-12-31 DIAGNOSIS — K219 Gastro-esophageal reflux disease without esophagitis: Secondary | ICD-10-CM | POA: Diagnosis not present

## 2020-12-31 LAB — CBC WITH DIFFERENTIAL/PLATELET
Abs Immature Granulocytes: 0.11 10*3/uL — ABNORMAL HIGH (ref 0.00–0.07)
Basophils Absolute: 0.1 10*3/uL (ref 0.0–0.1)
Basophils Relative: 0 %
Eosinophils Absolute: 0.1 10*3/uL (ref 0.0–0.5)
Eosinophils Relative: 1 %
HCT: 42.9 % (ref 39.0–52.0)
Hemoglobin: 14.8 g/dL (ref 13.0–17.0)
Immature Granulocytes: 1 %
Lymphocytes Relative: 26 %
Lymphs Abs: 3 10*3/uL (ref 0.7–4.0)
MCH: 31 pg (ref 26.0–34.0)
MCHC: 34.5 g/dL (ref 30.0–36.0)
MCV: 89.7 fL (ref 80.0–100.0)
Monocytes Absolute: 1.3 10*3/uL — ABNORMAL HIGH (ref 0.1–1.0)
Monocytes Relative: 11 %
Neutro Abs: 6.9 10*3/uL (ref 1.7–7.7)
Neutrophils Relative %: 61 %
Platelets: 250 10*3/uL (ref 150–400)
RBC: 4.78 MIL/uL (ref 4.22–5.81)
RDW: 12.8 % (ref 11.5–15.5)
WBC: 11.4 10*3/uL — ABNORMAL HIGH (ref 4.0–10.5)
nRBC: 0 % (ref 0.0–0.2)

## 2020-12-31 LAB — BASIC METABOLIC PANEL
Anion gap: 7 (ref 5–15)
BUN: 10 mg/dL (ref 6–20)
CO2: 28 mmol/L (ref 22–32)
Calcium: 9 mg/dL (ref 8.9–10.3)
Chloride: 101 mmol/L (ref 98–111)
Creatinine, Ser: 0.69 mg/dL (ref 0.61–1.24)
GFR, Estimated: >60 mL/min (ref >60)
Glucose, Bld: 101 mg/dL — ABNORMAL HIGH (ref 70–99)
Potassium: 3.7 mmol/L (ref 3.5–5.1)
Sodium: 136 mmol/L (ref 135–145)

## 2020-12-31 MED ORDER — POTASSIUM CHLORIDE 20 MEQ PO PACK
40.0000 meq | PACK | Freq: Once | ORAL | Status: AC
  Administered 2020-12-31: 40 meq via ORAL
  Filled 2020-12-31: qty 2

## 2020-12-31 NOTE — Progress Notes (Signed)
Notified by tele of 2.15 second pause. MD notified.

## 2020-12-31 NOTE — Progress Notes (Signed)
PROGRESS NOTE    Austin Zuniga  UJW:[id number] DOB: May 14, 1991 DOA: 12/27/2020 PCP: Patient, No Pcp Per    Brief Narrative:  Mr. Austin Zuniga was admitted to the hospital working diagnosis ofearlyright peritonsillar abscess,supraglottitis(failed outpatient therapy).  30 year old male with past medical history of GERD nicotine dependence who presented with sore throat. Reported 24 hours of worsening sore throat, more severe over the last 24 hours prior to hospitalization. Pain localized to the right side of the neck, severe intensity, nonradiating,associated with fevers and difficulty swallowing(solids and liquids),poor oral intake. 03/22he was diagnosed with tonsillitis in the ED at [facility], received clindamycin, ibuprofen and was discharged home. Due to persistent pain he was unable to take p.o. medications and came back to the hospital for further evaluation. On his initial physical examination blood pressure 141/78, heart rate 120, respirate 21, temperature 39.3 C, oxygen saturation 92%, patient had significant pain upon palpation of the right side of his neck, positive lymphadenopathy, neck supple, lungs clear to auscultation bilaterally, heart S1-S2, present, tachycardic, abdomen soft, no lower extremity edema.  Sodium 138, potassium 3.5, chloride 104, bicarb 25, glucose 110, BUN 21, creatinine 1.0, lactic acid 1.3, white count 9.5, hemoglobin 15.0, hematocrit 43.3, platelets 215. SARS COVID-19 negative. Urinalysis specific gravity 1.045  EKG had 19 bpm, normal axis, normal intervals, sinus rhythm, no significant ST segment changes, negative T wave lead II, lead III, aVF, V5-V6.  Soft tissue CT scan of the neck, acute right-sided tonsillitis and pharyngitis, involvement of the right epiglottitis and aryepiglottic fold consistent with supraglottitis. 1.8 x 1.5 x 1.9 cm hypodense area in the right buttock thin tonsil suspicious for early abscess. Mildly enlarged cervical  adenopathy.  Patient continue to have significant throat pain and odynophagia.  He had vagal related bradycardia and 1,6 sinus pause with no symptoms and stable blood pressure.   No further significant bradycardic episodes, continue to have odynophagia, but slowly improving.  Placed on steroids for anti-inflammatory effect, he did not tolerate non steroidal antiinflammatory therapy (flushing).    Assessment & Plan:   Principal Problem:   Peritonsillar abscess Active Problems:   GERD without esophagitis   Sepsis (HCC)   Nicotine dependence, cigarettes, uncomplicated   Dehydration   1. Right sided tonsillitis with pharyngitis, early peritonsillar abscess complicated with sepsis(present on admission). His pain is down to 7 from 10, he has been placed on systemic steroids.  Continue to be afebrile, wbc is 11,4 this am.   Antibiotic therapy with IVUnasyn, still not able to tolerate po tablets or capsules.   He has persistent pain, did not tolerated IV ketorolac and not able to take oral medication due to odynophagia.  Advance diet to full liquids and continue with IV fluids at a reduced rate of 50 ml per hr.  Continue pain control with hydromorphone and oxycodone.  Out of bed to chair tid with meals. Ambulate in the hallway.   2. Dehydration/ hypokalemia.K is 3,7 with serum cr at 0,69 with serum bicarbonate at 28 and Cl 101.  Decrease IV fluids to 50 ml per hr, dextrose and isotonic saline, and advance diet to soft.  3. Sinus bradycardia with sinus pauses/ suspected vagal hyperreactivity. Telemetry personally reviewed with no further significant bradycardia or sinus pauses. Echocardiogram with preserved EF and no structural changes.   3. GERD. On IV antiacids and as need antiemetics, advance diet to full liquids for now,   4. Obesity class 1. Calculated BMI is 33.8,  Patient continue to be at high risk for worsening  peritonsillar infection   Status is:  Inpatient  Remains inpatient appropriate because:IV treatments appropriate due to intensity of illness or inability to take PO   Dispo: The patient is from: Home              Anticipated d/c is to: Home              Patient currently is not medically stable to d/c.   Difficult to place patient No    DVT prophylaxis: enoxaparin   Code Status:    full  Family Communication:  No family at the bedside      Antimicrobials:   Unasyn    Subjective: Patient's pain continue to be moderate to severe, down to 7 from 10 this am, no nausea or vomiting, continue to have odynophagia.   Objective: Vitals:   12/30/20 1602 12/30/20 1959 12/31/20 0025 12/31/20 0440  BP: 123/73 131/80 123/76 126/83  Pulse:  90 82 60  Resp:  18 20 16   Temp: 98.4 F (36.9 C) 98 F (36.7 C) 98.6 F (37 C) 97.6 F (36.4 C)  TempSrc: Oral Oral Oral Oral  SpO2:  100% 95% 99%  Weight:      Height:        Intake/Output Summary (Last 24 hours) at 12/31/2020 1111 Last data filed at 12/31/2020 0400 Gross per 24 hour  Intake 3340.74 ml  Output 2400 ml  Net 940.74 ml   Filed Weights   12/28/20 0941  Weight: 116.5 kg    Examination:   General: Not in pain or dyspnea, deconditioned  Neurology: Awake and alert, non focal  E ENT: no pallor, no icterus, oral mucosa moist. Tender and induration at the right submandibular region, anteriorly. No stridor.  Cardiovascular: No JVD. S1-S2 present, rhythmic, no gallops, rubs, or murmurs. No lower extremity edema. Pulmonary: vesicular breath sounds bilaterally, adequate air movement, no wheezing, rhonchi or rales. Gastrointestinal. Abdomen soft and non tender Skin. No rashes Musculoskeletal: no joint deformities     Data Reviewed: I have personally reviewed following labs and imaging studies  CBC: Recent Labs  Lab 12/26/20 0722 12/28/20 0116 12/29/20 0225 12/30/20 0155 12/31/20 0900  WBC 16.9* 9.5 10.9* 11.9* 11.4*  NEUTROABS 13.7* 7.7 7.5 8.6* 6.9   HGB 16.3 15.0 14.5 14.9 14.8  HCT 47.8 43.3 42.6 45.8 42.9  MCV 90.0 90.4 91.2 93.5 89.7  PLT 259 215 204 210 250   Basic Metabolic Panel: Recent Labs  Lab 12/26/20 0722 12/28/20 0116 12/29/20 0225 12/30/20 0155 12/31/20 0900  NA 138 138 138 137 136  K 3.7 3.5 3.5 3.3* 3.7  CL 103 104 102 100 101  CO2 26 25 26 28 28   GLUCOSE 114* 110* 96 96 101*  BUN 13 21* 14 8 10   CREATININE 0.92 1.06 0.73 0.70 0.69  CALCIUM 9.3 9.2 8.8* 9.1 9.0   GFR: Estimated Creatinine Clearance: 185.4 mL/min (by C-G formula based on SCr of 0.69 mg/dL). Liver Function Tests: Recent Labs  Lab 12/28/20 0116 12/29/20 0225  AST 28 17  ALT 40 30  ALKPHOS 68 63  BILITOT 1.0 0.6  PROT 7.8 6.7  ALBUMIN 3.6 3.0*   No results for input(s): LIPASE, AMYLASE in the last 168 hours. No results for input(s): AMMONIA in the last 168 hours. Coagulation Profile: Recent Labs  Lab 12/28/20 0116  INR 1.2   Cardiac Enzymes: No results for input(s): CKTOTAL, CKMB, CKMBINDEX, TROPONINI in the last 168 hours. BNP (last 3 results) No results for input(s):  PROBNP in the last 8760 hours. HbA1C: No results for input(s): HGBA1C in the last 72 hours. CBG: No results for input(s): GLUCAP in the last 168 hours. Lipid Profile: No results for input(s): CHOL, HDL, LDLCALC, TRIG, CHOLHDL, LDLDIRECT in the last 72 hours. Thyroid Function Tests: No results for input(s): TSH, T4TOTAL, FREET4, T3FREE, THYROIDAB in the last 72 hours. Anemia Panel: No results for input(s): VITAMINB12, FOLATE, FERRITIN, TIBC, IRON, RETICCTPCT in the last 72 hours.    Radiology Studies: I have reviewed all of the imaging during this hospital visit personally     Scheduled Meds: . enoxaparin (LOVENOX) injection  40 mg Subcutaneous Daily  . methylPREDNISolone (SOLU-MEDROL) injection  20 mg Intravenous Q24H  . nicotine  14 mg Transdermal Daily  . pantoprazole (PROTONIX) IV  40 mg Intravenous Daily  . potassium chloride  40 mEq Oral  Once   Continuous Infusions: . ampicillin-sulbactam (UNASYN) IV 3 g (12/31/20 0542)  . dextrose 5 % and 0.9% NaCl 75 mL/hr at 12/31/20 7209     LOS: 3 days        Mauricio Annett Gula, MD

## 2021-01-01 DIAGNOSIS — E860 Dehydration: Secondary | ICD-10-CM | POA: Diagnosis not present

## 2021-01-01 DIAGNOSIS — K219 Gastro-esophageal reflux disease without esophagitis: Secondary | ICD-10-CM | POA: Diagnosis not present

## 2021-01-01 DIAGNOSIS — F17210 Nicotine dependence, cigarettes, uncomplicated: Secondary | ICD-10-CM | POA: Diagnosis not present

## 2021-01-01 DIAGNOSIS — J36 Peritonsillar abscess: Secondary | ICD-10-CM | POA: Diagnosis not present

## 2021-01-01 LAB — BASIC METABOLIC PANEL
Anion gap: 8 (ref 5–15)
BUN: 11 mg/dL (ref 6–20)
CO2: 25 mmol/L (ref 22–32)
Calcium: 8.8 mg/dL — ABNORMAL LOW (ref 8.9–10.3)
Chloride: 102 mmol/L (ref 98–111)
Creatinine, Ser: 0.74 mg/dL (ref 0.61–1.24)
GFR, Estimated: >60 mL/min (ref >60)
Glucose, Bld: 97 mg/dL (ref 70–99)
Potassium: 3.5 mmol/L (ref 3.5–5.1)
Sodium: 135 mmol/L (ref 135–145)

## 2021-01-01 LAB — CBC WITH DIFFERENTIAL/PLATELET
Abs Immature Granulocytes: 0.15 10*3/uL — ABNORMAL HIGH (ref 0.00–0.07)
Basophils Absolute: 0 10*3/uL (ref 0.0–0.1)
Basophils Relative: 0 %
Eosinophils Absolute: 0.1 10*3/uL (ref 0.0–0.5)
Eosinophils Relative: 1 %
HCT: 42.3 % (ref 39.0–52.0)
Hemoglobin: 14.7 g/dL (ref 13.0–17.0)
Immature Granulocytes: 1 %
Lymphocytes Relative: 24 %
Lymphs Abs: 2.6 10*3/uL (ref 0.7–4.0)
MCH: 30.8 pg (ref 26.0–34.0)
MCHC: 34.8 g/dL (ref 30.0–36.0)
MCV: 88.7 fL (ref 80.0–100.0)
Monocytes Absolute: 0.8 10*3/uL (ref 0.1–1.0)
Monocytes Relative: 8 %
Neutro Abs: 7.1 10*3/uL (ref 1.7–7.7)
Neutrophils Relative %: 66 %
Platelets: 301 10*3/uL (ref 150–400)
RBC: 4.77 MIL/uL (ref 4.22–5.81)
RDW: 12.9 % (ref 11.5–15.5)
WBC: 10.7 10*3/uL — ABNORMAL HIGH (ref 4.0–10.5)
nRBC: 0 % (ref 0.0–0.2)

## 2021-01-01 LAB — MAGNESIUM: Magnesium: 2.3 mg/dL (ref 1.7–2.4)

## 2021-01-01 MED ORDER — PANTOPRAZOLE SODIUM 40 MG PO TBEC
40.0000 mg | DELAYED_RELEASE_TABLET | Freq: Every day | ORAL | Status: DC
  Administered 2021-01-02: 40 mg via ORAL
  Filled 2021-01-01: qty 1

## 2021-01-01 MED ORDER — POTASSIUM CHLORIDE CRYS ER 20 MEQ PO TBCR
40.0000 meq | EXTENDED_RELEASE_TABLET | ORAL | Status: AC
  Administered 2021-01-01 (×2): 40 meq via ORAL
  Filled 2021-01-01 (×2): qty 2

## 2021-01-01 MED ORDER — ACETAMINOPHEN 160 MG/5ML PO SOLN
650.0000 mg | Freq: Four times a day (QID) | ORAL | Status: DC
  Administered 2021-01-01 – 2021-01-02 (×3): 650 mg via ORAL
  Filled 2021-01-01 (×3): qty 20.3

## 2021-01-01 MED ORDER — POTASSIUM CHLORIDE 20 MEQ PO PACK
40.0000 meq | PACK | ORAL | Status: DC
  Filled 2021-01-01: qty 2

## 2021-01-01 NOTE — Progress Notes (Signed)
PROGRESS NOTE    [name]  BWI:[id number] DOB: 26-Sep-1991 DOA: 12/27/2020 PCP: Patient, No Pcp Per    Brief Narrative:  Mr. Austin Zuniga was admitted to the hospital working diagnosis ofearlyright peritonsillar abscess,supraglottitis(failed outpatient therapy).  30 year old male with past medical history of GERD nicotine dependence who presented with sore throat. Reported 24 hours of worsening sore throat, more severe over the last 24 hours prior to hospitalization. Pain localized to the right side of the neck, severe intensity, nonradiating,associated with fevers and difficulty swallowing(solids and liquids),poor oral intake. 03/22he was diagnosed with tonsillitis in the ED at [facility], received clindamycin, ibuprofen and was discharged home. Due to persistent pain he was unable to take p.o. medications and came back to the hospital for further evaluation. On his initial physical examination blood pressure 141/78, heart rate 120, respirate 21, temperature 39.3 C, oxygen saturation 92%, patient had significant pain upon palpation of the right side of his neck, positive lymphadenopathy, neck supple, lungs clear to auscultation bilaterally, heart S1-S2, present, tachycardic, abdomen soft, no lower extremity edema.  Sodium 138, potassium 3.5, chloride 104, bicarb 25, glucose 110, BUN 21, creatinine 1.0, lactic acid 1.3, white count 9.5, hemoglobin 15.0, hematocrit 43.3, platelets 215. SARS COVID-19 negative. Urinalysis specific gravity 1.045  EKG had 19 bpm, normal axis, normal intervals, sinus rhythm, no significant ST segment changes, negative T wave lead II, lead III, aVF, V5-V6.  Soft tissue CT scan of the neck, acute right-sided tonsillitis and pharyngitis, involvement of the right epiglottitis and aryepiglottic fold consistent with supraglottitis. 1.8 x 1.5 x 1.9 cm hypodense area in the right buttock thin tonsil suspicious for early abscess. Mildly enlarged cervical  adenopathy.  Patient continue to have significant throat pain and odynophagia.  He had vagal related bradycardia and 1,6 sinus pause with no symptoms and stable blood pressure.  No further significant bradycardic episodes, continue to have odynophagia, but slowly improving.  Placed on steroids for anti-inflammatory effect, he did not tolerate IV non steroidal antiinflammatory therapy (flushing).    Assessment & Plan:   Principal Problem:   Peritonsillar abscess Active Problems:   GERD without esophagitis   Sepsis (HCC)   Nicotine dependence, cigarettes, uncomplicated   Dehydration    1. Right sided tonsillitis with pharyngitis, early peritonsillar abscess complicated with sepsis(present on admission). This am with significant improvement in his throat pain. Wbc is down to 10,7 and he continue to be afebrile.   Continue antibiotic therapy with IV Unasyn, and anti-inflammatory therapy with systemic corticosteroids. Follow cell count in am, if further improvement in symptoms will plan to transition to oral antibiotic therapy in am.   Continue pain control with acetaminophen scheduled plus oxycodone and hydromorphone as needed.   2. Dehydration/ hypokalemia.improved odynophagia and po intake, will advance diet to regular. Renal function with serum cr at 0,74, K is 3,5 and Mg is 2,3. Continue K correction with KCl, will give 80 meq in 2 divided doses and follow electrolytes in am.  Discontinue IV fluids.   3. Sinus bradycardia with sinus pauses/ suspected vagal hyperreactivity. Echocardiogram with preserved EF and no structural changes.   Personally reviewed telemetry no significant sinus pauses or arrhythmias. Continue with telemetry monitoring for now, bradycardia predominantly when sleeping, will need outpatient sleep study,   3. GERD. change pantoprazole to po, continue as needed antiemetics and analgesics  4. Obesity class 1. Calculated BMI is 33.8,follow as  outpatient.    Status is: Inpatient  Remains inpatient appropriate because:Inpatient level of care appropriate due to  severity of illness   Dispo: The patient is from: Home              Anticipated d/c is to: Home              Patient currently is not medically stable to d/c.   Difficult to place patient No   DVT prophylaxis: Enoxaparin   Code Status:   full  Family Communication:  No family at the bedside     Antimicrobials:   Unasyn     Subjective: Patient with improvement in throat pain, improved in odynophagia and po intake, no nausea or vomiting.   Objective: Vitals:   12/31/20 1433 12/31/20 2100 01/01/21 0310 01/01/21 0736  BP: 131/85 126/78 131/67 (!) 141/86  Pulse:  60 (!) 56   Resp: 18 17 14 17   Temp: 98.8 F (37.1 C) 98 F (36.7 C) 97.7 F (36.5 C) 97.7 F (36.5 C)  TempSrc: Oral Oral Oral Oral  SpO2:  99% 97% 98%  Weight:      Height:        Intake/Output Summary (Last 24 hours) at 01/01/2021 1208 Last data filed at 12/31/2020 2351 Gross per 24 hour  Intake 1298.33 ml  Output 2400 ml  Net -1101.67 ml   Filed Weights   12/28/20 0941  Weight: 116.5 kg    Examination:   General: Not in pain or dyspnea,  Neurology: Awake and alert, non focal  E ENT: no pallor, no icterus, oral mucosa moist. Improved tenderness to palpation at the right submandibular region, no induration or erythema.  Cardiovascular: No JVD. S1-S2 present, rhythmic, no gallops, rubs, or murmurs. No lower extremity edema. Pulmonary: positive breath sounds bilaterally, Gastrointestinal. Abdomen soft and non tender Skin. No rashes Musculoskeletal: no joint deformities     Data Reviewed: I have personally reviewed following labs and imaging studies  CBC: Recent Labs  Lab 12/28/20 0116 12/29/20 0225 12/30/20 0155 12/31/20 0900 01/01/21 0252  WBC 9.5 10.9* 11.9* 11.4* 10.7*  NEUTROABS 7.7 7.5 8.6* 6.9 7.1  HGB 15.0 14.5 14.9 14.8 14.7  HCT 43.3 42.6 45.8 42.9 42.3   MCV 90.4 91.2 93.5 89.7 88.7  PLT 215 204 210 250 301   Basic Metabolic Panel: Recent Labs  Lab 12/28/20 0116 12/29/20 0225 12/30/20 0155 12/31/20 0900 01/01/21 0252  NA 138 138 137 136 135  K 3.5 3.5 3.3* 3.7 3.5  CL 104 102 100 101 102  CO2 25 26 28 28 25   GLUCOSE 110* 96 96 101* 97  BUN 21* 14 8 10 11   CREATININE 1.06 0.73 0.70 0.69 0.74  CALCIUM 9.2 8.8* 9.1 9.0 8.8*  MG  --   --   --   --  2.3   GFR: Estimated Creatinine Clearance: 185.4 mL/min (by C-G formula based on SCr of 0.74 mg/dL). Liver Function Tests: Recent Labs  Lab 12/28/20 0116 12/29/20 0225  AST 28 17  ALT 40 30  ALKPHOS 68 63  BILITOT 1.0 0.6  PROT 7.8 6.7  ALBUMIN 3.6 3.0*   No results for input(s): LIPASE, AMYLASE in the last 168 hours. No results for input(s): AMMONIA in the last 168 hours. Coagulation Profile: Recent Labs  Lab 12/28/20 0116  INR 1.2   Cardiac Enzymes: No results for input(s): CKTOTAL, CKMB, CKMBINDEX, TROPONINI in the last 168 hours. BNP (last 3 results) No results for input(s): PROBNP in the last 8760 hours. HbA1C: No results for input(s): HGBA1C in the last 72 hours. CBG: No results for  input(s): GLUCAP in the last 168 hours. Lipid Profile: No results for input(s): CHOL, HDL, LDLCALC, TRIG, CHOLHDL, LDLDIRECT in the last 72 hours. Thyroid Function Tests: No results for input(s): TSH, T4TOTAL, FREET4, T3FREE, THYROIDAB in the last 72 hours. Anemia Panel: No results for input(s): VITAMINB12, FOLATE, FERRITIN, TIBC, IRON, RETICCTPCT in the last 72 hours.    Radiology Studies: I have reviewed all of the imaging during this hospital visit personally     Scheduled Meds: . enoxaparin (LOVENOX) injection  40 mg Subcutaneous Daily  . methylPREDNISolone (SOLU-MEDROL) injection  20 mg Intravenous Q24H  . nicotine  14 mg Transdermal Daily  . pantoprazole (PROTONIX) IV  40 mg Intravenous Daily  . potassium chloride  40 mEq Oral Q4H   Continuous Infusions: .  ampicillin-sulbactam (UNASYN) IV 3 g (01/01/21 0523)  . dextrose 5 % and 0.9% NaCl 50 mL/hr at 12/31/20 2153     LOS: 4 days        Mariany Mackintosh Annett Gula, MD

## 2021-01-01 NOTE — Progress Notes (Signed)
Pt rested well through out the day. Uneventful shift.

## 2021-01-02 DIAGNOSIS — K219 Gastro-esophageal reflux disease without esophagitis: Secondary | ICD-10-CM | POA: Diagnosis not present

## 2021-01-02 DIAGNOSIS — F17210 Nicotine dependence, cigarettes, uncomplicated: Secondary | ICD-10-CM | POA: Diagnosis not present

## 2021-01-02 DIAGNOSIS — E860 Dehydration: Secondary | ICD-10-CM | POA: Diagnosis not present

## 2021-01-02 DIAGNOSIS — J36 Peritonsillar abscess: Secondary | ICD-10-CM | POA: Diagnosis not present

## 2021-01-02 LAB — CBC WITH DIFFERENTIAL/PLATELET
Abs Immature Granulocytes: 0.22 10*3/uL — ABNORMAL HIGH (ref 0.00–0.07)
Basophils Absolute: 0.1 10*3/uL (ref 0.0–0.1)
Basophils Relative: 1 %
Eosinophils Absolute: 0.1 10*3/uL (ref 0.0–0.5)
Eosinophils Relative: 1 %
HCT: 43.4 % (ref 39.0–52.0)
Hemoglobin: 14.8 g/dL (ref 13.0–17.0)
Immature Granulocytes: 2 %
Lymphocytes Relative: 24 %
Lymphs Abs: 3.2 10*3/uL (ref 0.7–4.0)
MCH: 30.8 pg (ref 26.0–34.0)
MCHC: 34.1 g/dL (ref 30.0–36.0)
MCV: 90.2 fL (ref 80.0–100.0)
Monocytes Absolute: 1.1 10*3/uL — ABNORMAL HIGH (ref 0.1–1.0)
Monocytes Relative: 8 %
Neutro Abs: 8.4 10*3/uL — ABNORMAL HIGH (ref 1.7–7.7)
Neutrophils Relative %: 64 %
Platelets: 365 10*3/uL (ref 150–400)
RBC: 4.81 MIL/uL (ref 4.22–5.81)
RDW: 13.1 % (ref 11.5–15.5)
WBC: 13 10*3/uL — ABNORMAL HIGH (ref 4.0–10.5)
nRBC: 0 % (ref 0.0–0.2)

## 2021-01-02 LAB — CULTURE, BLOOD (ROUTINE X 2)
Culture: NO GROWTH
Culture: NO GROWTH
Special Requests: ADEQUATE
Special Requests: ADEQUATE

## 2021-01-02 LAB — BASIC METABOLIC PANEL
Anion gap: 7 (ref 5–15)
BUN: 13 mg/dL (ref 6–20)
CO2: 28 mmol/L (ref 22–32)
Calcium: 8.8 mg/dL — ABNORMAL LOW (ref 8.9–10.3)
Chloride: 103 mmol/L (ref 98–111)
Creatinine, Ser: 0.96 mg/dL (ref 0.61–1.24)
GFR, Estimated: >60 mL/min (ref >60)
Glucose, Bld: 97 mg/dL (ref 70–99)
Potassium: 4.5 mmol/L (ref 3.5–5.1)
Sodium: 138 mmol/L (ref 135–145)

## 2021-01-02 MED ORDER — IBUPROFEN 400 MG PO TABS: 400.0000 mg | ORAL_TABLET | Freq: Four times a day (QID) | ORAL | 0 refills | Status: AC | PRN

## 2021-01-02 MED ORDER — AMOXICILLIN-POT CLAVULANATE 875-125 MG PO TABS
1.0000 | ORAL_TABLET | Freq: Two times a day (BID) | ORAL | 0 refills | Status: AC
Start: 2021-01-02 — End: 2021-01-12

## 2021-01-02 MED ORDER — PANTOPRAZOLE SODIUM 40 MG PO TBEC: 40.0000 mg | DELAYED_RELEASE_TABLET | Freq: Every day | ORAL | 0 refills | Status: AC

## 2021-01-02 MED ORDER — IBUPROFEN 200 MG PO TABS: 400.0000 mg | ORAL_TABLET | Freq: Four times a day (QID) | ORAL | Status: DC | PRN

## 2021-01-02 MED ORDER — ACETAMINOPHEN 500 MG PO TABS: 500.0000 mg | ORAL_TABLET | Freq: Four times a day (QID) | ORAL | 0 refills | Status: AC | PRN

## 2021-01-02 MED ORDER — AMOXICILLIN-POT CLAVULANATE 875-125 MG PO TABS
1.0000 | ORAL_TABLET | Freq: Two times a day (BID) | ORAL | Status: DC
  Administered 2021-01-02: 1 via ORAL
  Filled 2021-01-02: qty 1

## 2021-01-02 MED ORDER — CLINDAMYCIN HCL 150 MG PO CAPS: 150.0000 mg | ORAL_CAPSULE | Freq: Three times a day (TID) | ORAL | Status: DC

## 2021-01-02 NOTE — Discharge Summary (Signed)
Physician Discharge Summary  Austin Zuniga WUJ:[id number] DOB: 1991-07-28 DOA: 12/27/2020  PCP: Patient, No Pcp Per  Admit date: 12/27/2020 Discharge date: 01/02/2021  Admitted From: Home  Disposition:  Home   Recommendations for Outpatient Follow-up and new medication changes:  1. Follow up with Primary Care in 10 days.  2. Continue antibiotic therapy with oral clindamycin for 5 more days. 3. Pain control with acetaminophen and ibuprofen.   Home Health: no  Equipment/Devices: na    Discharge Condition: stable  CODE STATUS: full  Diet recommendation: regular.   Brief/Interim Summary: Austin Zuniga was admitted to the hospital with the working diagnosis ofearlyright peritonsillar abscess,supraglottitis(failed outpatient therapy).  30 year old male with past medical history of GERD nicotine dependence who presented with sore throat. Reported 24 hours of worsening sore throat, more severe over the last 24 hours prior to hospitalization. Pain localized to the right side of the neck, severe intensity, nonradiating,associated with fevers and difficulty swallowing(solids and liquids),poor oral intake. 03/22he was diagnosed with tonsillitis in the ED at [facility], received clindamycin, ibuprofen and was discharged home. Due to persistent pain he was unable to take p.o. medications and came back to the hospital for further evaluation. On his initial physical examination blood pressure 141/78, heart rate 120, respiratory rate 21, temperature 39.3 C, oxygen saturation 92%, patient had significant pain upon palpation of the right side of his neck, positive lymphadenopathy, neck supple, lungs clear to auscultation bilaterally, heart S1-S2, present, tachycardic, abdomen soft, no lower extremity edema.  Sodium 138, potassium 3.5, chloride 104, bicarb 25, glucose 110, BUN 21, creatinine 1.0, lactic acid 1.3, white count 9.5, hemoglobin 15.0, hematocrit 43.3, platelets 215. SARS COVID-19  negative. Urinalysis specific gravity 1.045  EKG had 19 bpm, normal axis, normal intervals, sinus rhythm, no significant ST segment changes, negative T wave lead II, lead III, aVF, V5-V6.  Soft tissue CT scan of the neck, acute right-sided tonsillitis and pharyngitis, involvement of the right epiglottitis and aryepiglottic fold consistent with supraglottitis. 1.8 x 1.5 x 1.9 cm hypodense area in the right palatine tonsil suspicious for early abscess. Mildly enlarged cervical adenopathy.  He had vagal related bradycardia and 1,6 second sinus pause with no symptoms and stable blood pressure.  No further significant bradycardic episodes, echocardiogram with no structural or valvular heart disease.  Due to persistent pain and odynophagia patient was placed on steroids for anti-inflammatory effect, he did not tolerate IV non steroidal antiinflammatory therapy (flushing).  At the time of discharge he had significant improvement in his symptoms.   Discharge Diagnoses:  Principal Problem:   Peritonsillar abscess Active Problems:   GERD without esophagitis   Sepsis (HCC)   Nicotine dependence, cigarettes, uncomplicated   Dehydration  1.  Right sided tonsillitis with pharyngitis, early peritonsillar abscess complicated with sepsis, present on admission.  Patient was admitted to the progressive care unit.  He received intravenous fluids and broad-spectrum IV antibiotic therapy with Unasyn.  ENT was consulted in the ED with recommendations of trial of antibiotics as initial strategy.  Patient had persistent odynophagia and neck pain, systemic steroids were added (methylprednisolonne 20 mg IV for 3 doses) with significant improvement of his symptoms.  At discharge patient has been transitioned to oral Augmentin, continue pain control with ibuprofen and acetaminophen. His white cell count at discharge is 13.0, mild elevation compared to the days prior, likely related to steroids.  Clinically  he has significantly improved.   Follow-up as an outpatient within 7 days.  2.  Dehydration/hypokalemia.  Patient received  intravenous fluids during his hospitalization with good toleration. Serum potassium was corrected with potassium chloride.  At discharge sodium 138, potassium 4.5, chloride 103, bicarb 28, glucose 97, BUN 13, creatinine 0.86.  Magnesium 2.3.  3.  Transitory sinus bradycardia with sinus pauses, suspected vagal hyperactivity.  Telemetry monitor showed sinus bradycardia, intermittent sinus pauses, EKG showed 1.6-second pause. Patient remained asymptomatic, bradycardia mainly while sleeping.  Further work-up with echocardiography showed preserved LV systolic function, with no valvular or structural heart disease. At discharge no further sinus arrhythmia.  4.  GERD.  Patient continue antiacid therapy with pantoprazole.  5.  Obesity class I.  Calculated BMI 33.8.  Follow-up as an outpatient.    Discharge Instructions   Allergies as of 01/02/2021   No Known Allergies     Medication List    STOP taking these medications   clindamycin 150 MG capsule Commonly known as: CLEOCIN     TAKE these medications   acetaminophen 500 MG tablet Commonly known as: TYLENOL Take 1 tablet (500 mg total) by mouth every 6 (six) hours as needed for mild pain.   amoxicillin-clavulanate 875-125 MG tablet Commonly known as: AUGMENTIN Take 1 tablet by mouth every 12 (twelve) hours for 10 days.   buPROPion 100 MG 12 hr tablet Commonly known as: WELLBUTRIN SR TAKE 1 TABLET BY MOUTH TWICE DAILY.   ibuprofen 400 MG tablet Commonly known as: ADVIL Take 1 tablet (400 mg total) by mouth every 6 (six) hours as needed for moderate pain (as needed for moderate to severe pain.). What changed:   medication strength  how much to take  reasons to take this   LORazepam 1 MG tablet Commonly known as: Ativan Take 1 tablet (1 mg total) by mouth 3 (three) times daily as needed for  anxiety.   pantoprazole 40 MG tablet Commonly known as: PROTONIX Take 1 tablet (40 mg total) by mouth daily.       No Known Allergies      Procedures/Studies: CT Soft Tissue Neck W Contrast  Result Date: 12/28/2020 CLINICAL DATA:  Initial evaluation for acute sore throat, right-side. EXAM: CT NECK WITH CONTRAST TECHNIQUE: Multidetector CT imaging of the neck was performed using the standard protocol following the bolus administration of intravenous contrast. CONTRAST:  75mL OMNIPAQUE IOHEXOL 300 MG/ML  SOLN COMPARISON:  Recent CT from 12/26/2020 FINDINGS: Pharynx and larynx: Continued evidence of acute right-sided tonsillitis with enlargement and heterogeneity of the right palatine tonsil, more prominent as compared to previous exam. Superimposed hypodensity measuring 1.8 x 1.5 x 1.9 cm suspicious for early tonsillar/peritonsillar abscess, increased in size from previous, although this remains fairly ill-defined (series 3, image 30). Extensive swelling and edematous changes throughout the adjacent right pharynx to the level of the piriform sinuses. Associated swelling and induration within the adjacent right parapharyngeal and submandibular spaces. Involvement of the right aryepiglottic folds and right aspect of the epiglottis, similar to previous, suggesting a degree of associated supraglottitis. The left tonsil is prominent as well without discrete collection. Glottis remains within normal limits inferiorly. Subglottic airway clear. Salivary glands: Salivary glands including the parotid and submandibular glands are normal. Thyroid: Normal. Lymph nodes: Prominent level II lymph nodes measure up to 1.2 cm on the right, presumably reactive. Vascular: Scattered foci of gas lucency seen within the venous system, likely related IV access. Normal intravascular enhancement seen elsewhere throughout the neck. Limited intracranial: Unremarkable. Visualized orbits: Not included on this exam. Mastoids and  visualized paranasal sinuses: Mild mucosal thickening within the  maxillary sinuses. Visualized paranasal sinuses are otherwise clear. Mastoid air cells and middle ear cavities are well pneumatized and free of fluid. Skeleton: No acute finding.  No worrisome osseous lesions. Upper chest: Visualized upper chest demonstrates no acute finding. Partially visualized lungs are clear. Other: None. IMPRESSION: 1. Continued evidence of acute right-sided tonsillitis and pharyngitis. Involvement of the right epiglottis and aryepiglottic fold consistent with associated supraglottitis. 1.8 x 1.5 x 1.9 cm hypodense area at the right palatine tonsil suspicious for early abscess, although this remains fairly ill-defined and may not reflect a true abscess as of yet. 2. Mildly enlarged cervical adenopathy, presumably reactive. Electronically Signed   By: Rise Mu M.D.   On: 12/28/2020 04:08   CT Soft Tissue Neck W Contrast  Result Date: 12/26/2020 CLINICAL DATA:  Right ear and neck pain over the last 3 days. Question abscess. EXAM: CT NECK WITH CONTRAST TECHNIQUE: Multidetector CT imaging of the neck was performed using the standard protocol following the bolus administration of intravenous contrast. CONTRAST:  75mL OMNIPAQUE IOHEXOL 300 MG/ML  SOLN COMPARISON:  None. FINDINGS: Pharynx and larynx: Advanced right tonsillitis with considerable inflammation of the parapharyngeal space extending from the level of the tonsils down as far as the larynx. Mass-effect upon the right side of the oropharynx and hypopharynx. Phlegmonous inflammation throughout this region, possibly with small actual peritonsillar abscess formation. Difficult to see a discrete well-marginated abscess. The left tonsil is prominent as well, but without evidence of advanced inflammatory change or abscess. Salivary glands: Parotid and submandibular glands are normal. Thyroid: Normal Lymph nodes: No asymmetric nodal enlargement or suppuration. Vascular:  Normal Limited intracranial: Normal Visualized orbits: Normal Mastoids and visualized paranasal sinuses: Clear Skeleton: Normal Upper chest: Negative Other: None IMPRESSION: Advanced right tonsillitis with considerable inflammation of the right parapharyngeal space extending from the level of the tonsils down as far as the larynx. Mass-effect upon the right side of the oropharynx and hypopharynx. Difficult to see a discrete well-marginated abscess, but because of the considerable phlegmonous inflammation, small abscesses could be hidden. The left tonsil is prominent as well, but without evidence of advanced inflammatory change or abscess. Electronically Signed   By: Paulina Fusi M.D.   On: 12/26/2020 08:55   ECHOCARDIOGRAM COMPLETE  Result Date: 12/30/2020    ECHOCARDIOGRAM REPORT   Patient Name:   Austin Zuniga Date of Exam: 12/30/2020 Medical Rec #:  [id number]    Height:       73.0 in Accession #:    [id number]   Weight:       256.8 lb Date of Birth:  05-06-1991     BSA:          2.393 m Patient Age:    29 years     BP:           135/67 mmHg Patient Gender: M            HR:           65 bpm. Exam Location:  Inpatient Procedure: 2D Echo, Cardiac Doppler, Color Doppler, 3D Echo and Strain Analysis Indications:    Bradycardia [[id number]]  History:        Patient has no prior history of Echocardiogram examinations.  Sonographer:    Leta Jungling RDCS Referring Phys: [id number] Matai Carpenito DANIEL Adelayde Minney IMPRESSIONS  1. Left ventricular ejection fraction, by estimation, is 60 to 65%. The left ventricle has normal function. The left ventricle has no regional wall motion abnormalities. Indeterminate diastolic filling due to E-A  fusion.  2. Right ventricular systolic function is normal. The right ventricular size is normal. Tricuspid regurgitation signal is inadequate for assessing PA pressure.  3. The mitral valve is grossly normal. No evidence of mitral valve regurgitation. No evidence of mitral stenosis.  4. The aortic valve  is tricuspid. Aortic valve regurgitation is not visualized. No aortic stenosis is present.  5. The inferior vena cava is normal in size with greater than 50% respiratory variability, suggesting right atrial pressure of 3 mmHg. FINDINGS  Left Ventricle: Left ventricular ejection fraction, by estimation, is 60 to 65%. The left ventricle has normal function. The left ventricle has no regional wall motion abnormalities. 3D left ventricular ejection fraction analysis performed but not reported based on interpreter judgement due to suboptimal quality. The left ventricular internal cavity size was normal in size. There is no left ventricular hypertrophy. Indeterminate diastolic filling due to E-A fusion. Right Ventricle: The right ventricular size is normal. No increase in right ventricular wall thickness. Right ventricular systolic function is normal. Tricuspid regurgitation signal is inadequate for assessing PA pressure. Left Atrium: Left atrial size was normal in size. Right Atrium: Right atrial size was normal in size. Pericardium: Trivial pericardial effusion is present. Mitral Valve: The mitral valve is grossly normal. No evidence of mitral valve regurgitation. No evidence of mitral valve stenosis. Tricuspid Valve: The tricuspid valve is grossly normal. Tricuspid valve regurgitation is trivial. No evidence of tricuspid stenosis. Aortic Valve: The aortic valve is tricuspid. Aortic valve regurgitation is not visualized. No aortic stenosis is present. Pulmonic Valve: The pulmonic valve was grossly normal. Pulmonic valve regurgitation is not visualized. No evidence of pulmonic stenosis. Aorta: The aortic root and ascending aorta are structurally normal, with no evidence of dilitation. Venous: The inferior vena cava is normal in size with greater than 50% respiratory variability, suggesting right atrial pressure of 3 mmHg. IAS/Shunts: The atrial septum is grossly normal.  LEFT VENTRICLE PLAX 2D LVIDd:         5.40 cm   Diastology LVIDs:         3.50 cm  LV e' medial:    9.46 cm/s LV PW:         0.90 cm  LV E/e' medial:  8.4 LV IVS:        0.90 cm  LV e' lateral:   10.40 cm/s LVOT diam:     2.30 cm  LV E/e' lateral: 7.7 LV SV:         84 LV SV Index:   35 LVOT Area:     4.15 cm  RIGHT VENTRICLE RV S prime:     16.80 cm/s TAPSE (M-mode): 2.2 cm LEFT ATRIUM           Index      RIGHT ATRIUM           Index LA diam:      3.30 cm 1.38 cm/m RA Area:     10.90 cm LA Vol (A2C): 20.9 ml 8.73 ml/m RA Volume:   19.40 ml  8.11 ml/m  AORTIC VALVE LVOT Vmax:   118.00 cm/s LVOT Vmean:  87.800 cm/s LVOT VTI:    0.202 m  AORTA Ao Root diam: 3.60 cm MITRAL VALVE MV Area (PHT): 5.97 cm    SHUNTS MV Decel Time: 127 msec    Systemic VTI:  0.20 m MV E velocity: 79.70 cm/s  Systemic Diam: 2.30 cm MV A velocity: 79.30 cm/s MV E/A ratio:  1.01 Lennie Odor MD Electronically  signed by Lennie Odor MD Signature Date/Time: 12/30/2020/1:28:27 PM    Final         Subjective: Patient is feeling better, no further neck pain or odynophagia, no nausea or vomiting, no dyspnea or chest pain.   Discharge Exam: Vitals:   01/01/21 1802 01/01/21 2045  BP: 117/72 121/70  Pulse:  (!) 101  Resp: 19 20  Temp: 97.8 F (36.6 C) 97.8 F (36.6 C)  SpO2: 98% 97%   Vitals:   01/01/21 0310 01/01/21 0736 01/01/21 1802 01/01/21 2045  BP: 131/67 (!) 141/86 117/72 121/70  Pulse: (!) 56   (!) 101  Resp: 14 17 19 20   Temp: 97.7 F (36.5 C) 97.7 F (36.5 C) 97.8 F (36.6 C) 97.8 F (36.6 C)  TempSrc: Oral Oral Oral Oral  SpO2: 97% 98% 98% 97%  Weight:      Height:        General: Not in pain or dyspnea.  Neurology: Awake and alert, non focal  E ENT: no pallor, no icterus, oral mucosa moist/ mild tender right submandibular region, improved.  Cardiovascular: No JVD. S1-S2 present, rhythmic, no gallops, rubs, or murmurs. No lower extremity edema. Pulmonary: positive breath sounds bilaterally, adequate air movement, no wheezing, rhonchi or  rales. Gastrointestinal. Abdomen soft and non tender Skin. No rashes Musculoskeletal: no joint deformities   The results of significant diagnostics from this hospitalization (including imaging, microbiology, ancillary and laboratory) are listed below for reference.     Microbiology: Recent Results (from the past 240 hour(s))  Group A Strep by PCR     Status: None   Collection Time: 12/26/20  7:22 AM   Specimen: Throat; Sterile Swab  Result Value Ref Range Status   Group A Strep by PCR NOT DETECTED NOT DETECTED Final    Comment: Performed at [facility], [address] [place], [place]  Blood Culture (routine x 2)     Status: None (Preliminary result)   Collection Time: 12/28/20  2:30 AM   Specimen: BLOOD RIGHT ARM  Result Value Ref Range Status   Specimen Description BLOOD RIGHT ARM  Final   Special Requests   Final    BOTTLES DRAWN AEROBIC AND ANAEROBIC Blood Culture adequate volume   Culture   Final    NO GROWTH 4 DAYS Performed at [facility], [address] [place], [place]    Report Status PENDING  Incomplete  Blood Culture (routine x 2)     Status: None (Preliminary result)   Collection Time: 12/28/20  2:42 AM   Specimen: BLOOD  Result Value Ref Range Status   Specimen Description BLOOD SITE NOT SPECIFIED  Final   Special Requests   Final    BOTTLES DRAWN AEROBIC AND ANAEROBIC Blood Culture adequate volume   Culture   Final    NO GROWTH 4 DAYS Performed at [facility], [address], [place], [place]    Report Status PENDING  Incomplete  Resp Panel by RT-PCR (Flu A&B, Covid) Nasopharyngeal Swab     Status: None   Collection Time: 12/28/20  3:05 AM   Specimen: Nasopharyngeal Swab; Nasopharyngeal(NP) swabs in vial transport medium  Result Value Ref Range Status   SARS Coronavirus 2 by RT PCR NEGATIVE NEGATIVE Final    Comment: (NOTE) SARS-CoV-2 target nucleic acids are NOT DETECTED.  The SARS-CoV-2  RNA is generally detectable in upper respiratory specimens during the acute phase of infection. The lowest concentration  of SARS-CoV-2 viral copies this assay can detect is 138 copies/mL. A negative result does not preclude SARS-Cov-2 infection and should not be used as the sole basis for treatment or other patient management decisions. A negative result may occur with  improper specimen collection/handling, submission of specimen other than nasopharyngeal swab, presence of viral mutation(s) within the areas targeted by this assay, and inadequate number of viral copies(<138 copies/mL). A negative result must be combined with clinical observations, patient history, and epidemiological information. The expected result is Negative.  Fact Sheet for Patients:  BloggerCourse.com  Fact Sheet for Healthcare Providers:  SeriousBroker.it  This test is no t yet approved or cleared by the [place] FDA and  has been authorized for detection and/or diagnosis of SARS-CoV-2 by FDA under an Emergency Use Authorization (EUA). This EUA will remain  in effect (meaning this test can be used) for the duration of the COVID-19 declaration under Section 564(b)(1) of the Act, 21 U.S.C.section 360bbb-3(b)(1), unless the authorization is terminated  or revoked sooner.       Influenza A by PCR NEGATIVE NEGATIVE Final   Influenza B by PCR NEGATIVE NEGATIVE Final    Comment: (NOTE) The Xpert Xpress SARS-CoV-2/FLU/RSV plus assay is intended as an aid in the diagnosis of influenza from Nasopharyngeal swab specimens and should not be used as a sole basis for treatment. Nasal washings and aspirates are unacceptable for Xpert Xpress SARS-CoV-2/FLU/RSV testing.  Fact Sheet for Patients: BloggerCourse.com  Fact Sheet for Healthcare Providers: SeriousBroker.it  This test is not yet approved or cleared by the  [place] FDA and has been authorized for detection and/or diagnosis of SARS-CoV-2 by FDA under an Emergency Use Authorization (EUA). This EUA will remain in effect (meaning this test can be used) for the duration of the COVID-19 declaration under Section 564(b)(1) of the Act, 21 U.S.C. section 360bbb-3(b)(1), unless the authorization is terminated or revoked.  Performed at [facility], [address], [place], [place] [address]   Urine culture     Status: Abnormal   Collection Time: 12/28/20  6:02 AM   Specimen: In/Out Cath Urine  Result Value Ref Range Status   Specimen Description IN/OUT CATH URINE  Final   Special Requests   Final    NONE Performed at [facility], [address] [place], [place] [address]    Culture 2,000 COLONIES/mL ESCHERICHIA COLI (A)  Final   Report Status 12/30/2020 FINAL  Final   Organism ID, Bacteria ESCHERICHIA COLI (A)  Final      Susceptibility   Escherichia coli - MIC*    AMPICILLIN 4 SENSITIVE Sensitive     CEFAZOLIN <=4 SENSITIVE Sensitive     CEFEPIME <=0.12 SENSITIVE Sensitive     CEFTRIAXONE <=0.25 SENSITIVE Sensitive     CIPROFLOXACIN <=0.25 SENSITIVE Sensitive     GENTAMICIN <=1 SENSITIVE Sensitive     IMIPENEM <=0.25 SENSITIVE Sensitive     NITROFURANTOIN <=16 SENSITIVE Sensitive     TRIMETH/SULFA <=20 SENSITIVE Sensitive     AMPICILLIN/SULBACTAM <=2 SENSITIVE Sensitive     PIP/TAZO <=4 SENSITIVE Sensitive     * 2,000 COLONIES/mL ESCHERICHIA COLI     Labs: BNP (last 3 results) No results for input(s): BNP in the last 8760 hours. Basic Metabolic Panel: Recent Labs  Lab 12/29/20 0225 12/30/20 0155 12/31/20 0900 01/01/21 0252 01/02/21 0215  NA 138 137 136 135 138  K 3.5 3.3* 3.7 3.5 4.5  CL 102 100 101 102 103  CO2  26 28 28 25 28   GLUCOSE 96 96 101* 97 97  BUN 14 8 10 11 13   CREATININE 0.73 0.70 0.69 0.74 0.96  CALCIUM 8.8* 9.1 9.0 8.8* 8.8*  MG  --   --   --  2.3  --    Liver Function Tests: Recent  Labs  Lab 12/28/20 0116 12/29/20 0225  AST 28 17  ALT 40 30  ALKPHOS 68 63  BILITOT 1.0 0.6  PROT 7.8 6.7  ALBUMIN 3.6 3.0*   No results for input(s): LIPASE, AMYLASE in the last 168 hours. No results for input(s): AMMONIA in the last 168 hours. CBC: Recent Labs  Lab 12/29/20 0225 12/30/20 0155 12/31/20 0900 01/01/21 0252 01/02/21 0215  WBC 10.9* 11.9* 11.4* 10.7* 13.0*  NEUTROABS 7.5 8.6* 6.9 7.1 8.4*  HGB 14.5 14.9 14.8 14.7 14.8  HCT 42.6 45.8 42.9 42.3 43.4  MCV 91.2 93.5 89.7 88.7 90.2  PLT 204 210 250 301 365   Cardiac Enzymes: No results for input(s): CKTOTAL, CKMB, CKMBINDEX, TROPONINI in the last 168 hours. BNP: Invalid input(s): POCBNP CBG: No results for input(s): GLUCAP in the last 168 hours. D-Dimer No results for input(s): DDIMER in the last 72 hours. Hgb A1c No results for input(s): HGBA1C in the last 72 hours. Lipid Profile No results for input(s): CHOL, HDL, LDLCALC, TRIG, CHOLHDL, LDLDIRECT in the last 72 hours. Thyroid function studies No results for input(s): TSH, T4TOTAL, T3FREE, THYROIDAB in the last 72 hours.  Invalid input(s): FREET3 Anemia work up No results for input(s): VITAMINB12, FOLATE, FERRITIN, TIBC, IRON, RETICCTPCT in the last 72 hours. Urinalysis    Component Value Date/Time   COLORURINE AMBER (A) 12/28/2020 0614   APPEARANCEUR CLEAR 12/28/2020 0614   LABSPEC 1.045 (H) 12/28/2020 0614   PHURINE 6.0 12/28/2020 0614   GLUCOSEU NEGATIVE 12/28/2020 0614   HGBUR NEGATIVE 12/28/2020 0614   BILIRUBINUR NEGATIVE 12/28/2020 0614   KETONESUR 80 (A) 12/28/2020 0614   PROTEINUR NEGATIVE 12/28/2020 0614   UROBILINOGEN 1.0 06/12/2013 0159   NITRITE NEGATIVE 12/28/2020 0614   LEUKOCYTESUR NEGATIVE 12/28/2020 [id number]   Sepsis Labs Invalid input(s): PROCALCITONIN,  WBC,  LACTICIDVEN Microbiology Recent Results (from the past 240 hour(s))  Group A Strep by PCR     Status: None   Collection Time: 12/26/20  7:22 AM   Specimen: Throat;  Sterile Swab  Result Value Ref Range Status   Group A Strep by PCR NOT DETECTED NOT DETECTED Final    Comment: Performed at [facility], [address] [place], [place] [id number]  Blood Culture (routine x 2)     Status: None (Preliminary result)   Collection Time: 12/28/20  2:30 AM   Specimen: BLOOD RIGHT ARM  Result Value Ref Range Status   Specimen Description BLOOD RIGHT ARM  Final   Special Requests   Final    BOTTLES DRAWN AEROBIC AND ANAEROBIC Blood Culture adequate volume   Culture   Final    NO GROWTH 4 DAYS Performed at [facility], [address] [place], [place] [address]    Report Status PENDING  Incomplete  Blood Culture (routine x 2)     Status: None (Preliminary result)   Collection Time: 12/28/20  2:42 AM   Specimen: BLOOD  Result Value Ref Range Status   Specimen Description BLOOD SITE NOT SPECIFIED  Final   Special Requests   Final    BOTTLES DRAWN AEROBIC AND ANAEROBIC Blood Culture adequate volume   Culture  Final    NO GROWTH 4 DAYS Performed at [facility], [address], [place], [place] [address]    Report Status PENDING  Incomplete  Resp Panel by RT-PCR (Flu A&B, Covid) Nasopharyngeal Swab     Status: None   Collection Time: 12/28/20  3:05 AM   Specimen: Nasopharyngeal Swab; Nasopharyngeal(NP) swabs in vial transport medium  Result Value Ref Range Status   SARS Coronavirus 2 by RT PCR NEGATIVE NEGATIVE Final    Comment: (NOTE) SARS-CoV-2 target nucleic acids are NOT DETECTED.  The SARS-CoV-2 RNA is generally detectable in upper respiratory specimens during the acute phase of infection. The lowest concentration of SARS-CoV-2 viral copies this assay can detect is 138 copies/mL. A negative result does not preclude SARS-Cov-2 infection and should not be used as the sole basis for treatment or other patient management decisions. A negative result may occur with  improper specimen collection/handling, submission of  specimen other than nasopharyngeal swab, presence of viral mutation(s) within the areas targeted by this assay, and inadequate number of viral copies(<138 copies/mL). A negative result must be combined with clinical observations, patient history, and epidemiological information. The expected result is Negative.  Fact Sheet for Patients:  BloggerCourse.com  Fact Sheet for Healthcare Providers:  SeriousBroker.it  This test is no t yet approved or cleared by the [place] FDA and  has been authorized for detection and/or diagnosis of SARS-CoV-2 by FDA under an Emergency Use Authorization (EUA). This EUA will remain  in effect (meaning this test can be used) for the duration of the COVID-19 declaration under Section 564(b)(1) of the Act, 21 U.S.C.section 360bbb-3(b)(1), unless the authorization is terminated  or revoked sooner.       Influenza A by PCR NEGATIVE NEGATIVE Final   Influenza B by PCR NEGATIVE NEGATIVE Final    Comment: (NOTE) The Xpert Xpress SARS-CoV-2/FLU/RSV plus assay is intended as an aid in the diagnosis of influenza from Nasopharyngeal swab specimens and should not be used as a sole basis for treatment. Nasal washings and aspirates are unacceptable for Xpert Xpress SARS-CoV-2/FLU/RSV testing.  Fact Sheet for Patients: BloggerCourse.com  Fact Sheet for Healthcare Providers: SeriousBroker.it  This test is not yet approved or cleared by the [place] FDA and has been authorized for detection and/or diagnosis of SARS-CoV-2 by FDA under an Emergency Use Authorization (EUA). This EUA will remain in effect (meaning this test can be used) for the duration of the COVID-19 declaration under Section 564(b)(1) of the Act, 21 U.S.C. section 360bbb-3(b)(1), unless the authorization is terminated or revoked.  Performed at [facility], [address]  [place], [place] [address]   Urine culture     Status: Abnormal   Collection Time: 12/28/20  6:02 AM   Specimen: In/Out Cath Urine  Result Value Ref Range Status   Specimen Description IN/OUT CATH URINE  Final   Special Requests   Final    NONE Performed at [facility], [address], [place], [place] [address]    Culture 2,000 COLONIES/mL ESCHERICHIA COLI (A)  Final   Report Status 12/30/2020 FINAL  Final   Organism ID, Bacteria ESCHERICHIA COLI (A)  Final      Susceptibility   Escherichia coli - MIC*    AMPICILLIN 4 SENSITIVE Sensitive     CEFAZOLIN <=4 SENSITIVE Sensitive     CEFEPIME <=0.12 SENSITIVE Sensitive     CEFTRIAXONE <=0.25 SENSITIVE Sensitive     CIPROFLOXACIN <=0.25 SENSITIVE Sensitive  GENTAMICIN <=1 SENSITIVE Sensitive     IMIPENEM <=0.25 SENSITIVE Sensitive     NITROFURANTOIN <=16 SENSITIVE Sensitive     TRIMETH/SULFA <=20 SENSITIVE Sensitive     AMPICILLIN/SULBACTAM <=2 SENSITIVE Sensitive     PIP/TAZO <=4 SENSITIVE Sensitive     * 2,000 COLONIES/mL ESCHERICHIA COLI     Time coordinating discharge: 45 minutes  SIGNED:   Coralie Keens, MD  [facility] 01/02/2021, 9:03 AM

## 2022-03-25 ENCOUNTER — Encounter (HOSPITAL_COMMUNITY): Payer: Self-pay

## 2022-03-25 ENCOUNTER — Other Ambulatory Visit: Payer: Self-pay

## 2022-03-25 ENCOUNTER — Emergency Department (HOSPITAL_COMMUNITY): Payer: Medicaid Other

## 2022-03-25 ENCOUNTER — Emergency Department (HOSPITAL_COMMUNITY)
Admission: EM | Admit: 2022-03-25 | Discharge: 2022-03-25 | Disposition: A | Discharge status: Home / Self Care | Payer: Medicaid Other | Attending: Emergency Medicine | Admitting: Emergency Medicine

## 2022-03-25 DIAGNOSIS — M62838 Other muscle spasm: Secondary | ICD-10-CM | POA: Diagnosis not present

## 2022-03-25 DIAGNOSIS — M545 Low back pain, unspecified: Secondary | ICD-10-CM | POA: Insufficient documentation

## 2022-03-25 DIAGNOSIS — Y92410 Unspecified street and highway as the place of occurrence of the external cause: Secondary | ICD-10-CM | POA: Diagnosis not present

## 2022-03-25 MED ORDER — METHOCARBAMOL 500 MG PO TABS: 500.0000 mg | ORAL_TABLET | Freq: Two times a day (BID) | ORAL | 0 refills | Status: AC

## 2022-03-25 MED ORDER — NAPROXEN 500 MG PO TABS: 500.0000 mg | ORAL_TABLET | Freq: Two times a day (BID) | ORAL | 0 refills | Status: AC

## 2022-03-25 NOTE — Discharge Instructions (Signed)
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
## 2022-04-12 NOTE — Progress Notes (Deleted)
    SUBJECTIVE:   CHIEF COMPLAINT / HPI:  No chief complaint on file.   ***  PERTINENT  PMH / PSH: ***  Patient Care Team: Pcp, No as PCP - General   OBJECTIVE:   There were no vitals taken for this visit.  Physical Exam      03/13/2016    2:42 PM  Depression screen PHQ 2/9  Decreased Interest 0  Down, Depressed, Hopeless 0  PHQ - 2 Score 0     {Show previous vital signs (optional):23777}  {Labs  Heme  Chem  Endocrine  Serology  Results Review (optional):23779}  ASSESSMENT/PLAN:   No problem-specific Assessment & Plan notes found for this encounter.    No follow-ups on file.   Littie Deeds, MD [facility]

## 2022-04-15 ENCOUNTER — Ambulatory Visit: Payer: Medicaid Other | Admitting: Family Medicine

## 2022-05-31 ENCOUNTER — Other Ambulatory Visit: Payer: Self-pay

## 2022-05-31 ENCOUNTER — Encounter (HOSPITAL_BASED_OUTPATIENT_CLINIC_OR_DEPARTMENT_OTHER): Payer: Self-pay | Admitting: Emergency Medicine

## 2022-05-31 ENCOUNTER — Emergency Department (HOSPITAL_BASED_OUTPATIENT_CLINIC_OR_DEPARTMENT_OTHER)
Admission: EM | Admit: 2022-05-31 | Discharge: 2022-05-31 | Disposition: A | Discharge status: Home / Self Care | Payer: Medicaid Other | Attending: Emergency Medicine | Admitting: Emergency Medicine

## 2022-05-31 DIAGNOSIS — Z202 Contact with and (suspected) exposure to infections with a predominantly sexual mode of transmission: Secondary | ICD-10-CM | POA: Diagnosis not present

## 2022-05-31 DIAGNOSIS — U071 COVID-19: Secondary | ICD-10-CM | POA: Diagnosis not present

## 2022-05-31 DIAGNOSIS — Z79899 Other long term (current) drug therapy: Secondary | ICD-10-CM | POA: Diagnosis not present

## 2022-05-31 DIAGNOSIS — R059 Cough, unspecified: Secondary | ICD-10-CM | POA: Diagnosis present

## 2022-05-31 LAB — HIV ANTIBODY (ROUTINE TESTING W REFLEX): HIV Screen 4th Generation wRfx: NONREACTIVE

## 2022-05-31 LAB — URINALYSIS, ROUTINE W REFLEX MICROSCOPIC
Bilirubin Urine: NEGATIVE
Glucose, UA: NEGATIVE mg/dL
Hgb urine dipstick: NEGATIVE
Ketones, ur: NEGATIVE mg/dL
Leukocytes,Ua: NEGATIVE
Nitrite: NEGATIVE
Protein, ur: NEGATIVE mg/dL
Specific Gravity, Urine: >=1.03 (ref 1.005–1.030)
pH: 6 (ref 5.0–8.0)

## 2022-05-31 LAB — SARS CORONAVIRUS 2 BY RT PCR: SARS Coronavirus 2 by RT PCR: POSITIVE — AB

## 2022-05-31 MED ORDER — PENICILLIN G BENZATHINE 1200000 UNIT/2ML IM SUSY
2.4000 10*6.[IU] | PREFILLED_SYRINGE | Freq: Once | INTRAMUSCULAR | Status: AC
  Administered 2022-05-31: 2.4 10*6.[IU] via INTRAMUSCULAR
  Filled 2022-05-31: qty 4

## 2022-05-31 NOTE — ED Notes (Signed)
AVS reviewed with client and copy provided to pt as well. Work note from ED provider also provided. Assisted pt with setting up MyChart App on phone and how to navigate to find lab results. Opportunity for questions provided as well prior to DC to home

## 2022-05-31 NOTE — ED Provider Notes (Addendum)
[facility] EMERGENCY DEPARTMENT Provider Note   CSN: [id number] Arrival date & time: 05/31/22  1041     History  Chief Complaint  Patient presents with   Cough    Austin Zuniga is a 31 y.o. male with a past medical history of nicotine dependence presenting today with URI symptoms.  He reports that for the past 4 days he has been having a runny nose, headache, body aches and fatigue.  Started a new job recently and is unsure if he has been around sick contacts.  No difficulty breathing or chest pain.   Also requesting STD testing.  Endorses a syphilis exposure  The history is provided by the patient.  Cough      Home Medications Prior to Admission medications   Medication Sig Start Date End Date Taking? Authorizing Provider  acetaminophen (TYLENOL) 500 MG tablet Take 1 tablet (500 mg total) by mouth every 6 (six) hours as needed for mild pain. 01/02/21   Arrien, York Ram, MD  buPROPion (WELLBUTRIN SR) 100 MG 12 hr tablet TAKE 1 TABLET BY MOUTH TWICE DAILY. 03/14/16   Almon Hercules, MD  ibuprofen (ADVIL) 400 MG tablet Take 1 tablet (400 mg total) by mouth every 6 (six) hours as needed for moderate pain (as needed for moderate to severe pain.). 01/02/21   Arrien, York Ram, MD  LORazepam (ATIVAN) 1 MG tablet Take 1 tablet (1 mg total) by mouth 3 (three) times daily as needed for anxiety. 03/13/16   Almon Hercules, MD  methocarbamol (ROBAXIN) 500 MG tablet Take 1 tablet (500 mg total) by mouth 2 (two) times daily. 03/25/22   Henderly, Britni A, PA-C  naproxen (NAPROSYN) 500 MG tablet Take 1 tablet (500 mg total) by mouth 2 (two) times daily. 03/25/22   Henderly, Britni A, PA-C  pantoprazole (PROTONIX) 40 MG tablet Take 1 tablet (40 mg total) by mouth daily. 01/02/21   Arrien, York Ram, MD      Allergies    Patient has no known allergies.    Review of Systems   Review of Systems  Respiratory:  Positive for cough.     Physical Exam Updated Vital Signs Ht  6\' 1"  (1.854 m)   Wt 122.5 kg   BMI 35.62 kg/m  Physical Exam Vitals and nursing note reviewed.  Constitutional:      General: He is not in acute distress.    Appearance: Normal appearance. He is not ill-appearing.  HENT:     Head: Normocephalic and atraumatic.     Nose: Rhinorrhea present.     Mouth/Throat:     Mouth: Mucous membranes are dry.     Pharynx: Oropharynx is clear.  Eyes:     General: No scleral icterus.    Conjunctiva/sclera: Conjunctivae normal.  Cardiovascular:     Rate and Rhythm: Regular rhythm. Tachycardia present.  Pulmonary:     Effort: Pulmonary effort is normal. No respiratory distress.     Breath sounds: No wheezing.  Skin:    General: Skin is warm and dry.     Findings: No rash.  Neurological:     Mental Status: He is alert.  Psychiatric:        Mood and Affect: Mood normal.     ED Results / Procedures / Treatments   Labs (all labs ordered are listed, but only abnormal results are displayed) Labs Reviewed  SARS CORONAVIRUS 2 BY RT PCR - Abnormal; Notable for the following components:  Result Value   SARS Coronavirus 2 by RT PCR POSITIVE (*)    All other components within normal limits  URINALYSIS, ROUTINE W REFLEX MICROSCOPIC  RPR  HIV ANTIBODY (ROUTINE TESTING W REFLEX)  GC/CHLAMYDIA PROBE AMP ([place]) NOT AT [facility]    EKG None  Radiology No results found.  Procedures Procedures   Medications Ordered in ED Medications  penicillin g benzathine (BICILLIN LA) 1200000 UNIT/2ML injection 2.4 Million Units (has no administration in time range)    ED Course/ Medical Decision Making/ A&P                           Medical Decision Making Amount and/or Complexity of Data Reviewed Labs: ordered.  Risk Prescription drug management.   31 year old male presenting today due to URI symptoms.  Have been going on for 4 days.  Work of breathing within normal limits.  Mildly tachycardic however I suspect this is secondary to viral  illness.  Borderline febrile with 99.4.  Work-up: COVID test ordered as well as HIV and RPR.  GC chlamydia to be ordered on the urine.  Consider chest x-ray however patient denies shortness of breath or chest pain.  Lung sounds are clear as well.  Would not do this at this time.  Low likely Wells DVT score.  Unable to apply PERC due to elevated heart rate which I believe is secondary to viral illness.  Treatment: Given penicillin shot for recent syphilis exposure.  MDM/disposition: Patient is a 31 year old healthy male presenting today due to URI symptoms.  Also concerned about a syphilis exposure.  COVID test in process.  He will follow-up on STD results online.  Given penicillin shot due to exposure.  At this time he is hemodynamically stable and can be discharged home with over-the-counter medications.  Return precautions discussed, he is agreeable to the plan.  Work note provided.   Final Clinical Impression(s) / ED Diagnoses Final diagnoses:  Exposure to syphilis  COVID-19    Rx / DC Orders ED Discharge Orders     None      Results and diagnoses were explained to the patient. Return precautions discussed in full. Patient had no additional questions and expressed complete understanding.   This chart was dictated using voice recognition software.  Despite best efforts to proofread,  errors can occur which can change the documentation meaning.    Saddie Benders, PA-C 05/31/22 1144    Saoirse Legere A, PA-C 05/31/22 1146    [place], [place] K, [place] 05/31/22 1229

## 2022-05-31 NOTE — ED Triage Notes (Signed)
Pt arrives pov, c/o cough, congestion, fatigue and HA x 5 days. Denies fever. Requesting covid test. Also requests STD test, concern for exposure
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
## 2022-05-31 NOTE — Discharge Instructions (Addendum)
You may use any over-the-counter cold and flu medication for your COVID.  DayQuil and NyQuil are good options.    You may follow-up on your STD results online. 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  Please continue to follow-up with the health department for any further STD concerns.  Health department options:  [address] [place], [place] [address] [phone]  [facility] Department [address] [place]. [place], [address] [phone]

## 2022-06-01 LAB — RPR: RPR Ser Ql: NONREACTIVE

## 2022-06-03 LAB — GC/CHLAMYDIA PROBE AMP (~~LOC~~) NOT AT ARMC
Chlamydia: NEGATIVE
Comment: NEGATIVE
Comment: NORMAL
Neisseria Gonorrhea: NEGATIVE
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
## 2022-08-15 ENCOUNTER — Encounter (HOSPITAL_BASED_OUTPATIENT_CLINIC_OR_DEPARTMENT_OTHER): Payer: Self-pay | Admitting: Emergency Medicine

## 2022-08-15 ENCOUNTER — Emergency Department (HOSPITAL_BASED_OUTPATIENT_CLINIC_OR_DEPARTMENT_OTHER)
Admission: EM | Admit: 2022-08-15 | Discharge: 2022-08-15 | Disposition: A | Discharge status: Home / Self Care | Payer: Medicaid Other | Attending: Emergency Medicine | Admitting: Emergency Medicine

## 2022-08-15 ENCOUNTER — Other Ambulatory Visit: Payer: Self-pay

## 2022-08-15 DIAGNOSIS — J988 Other specified respiratory disorders: Secondary | ICD-10-CM | POA: Diagnosis not present

## 2022-08-15 DIAGNOSIS — B9789 Other viral agents as the cause of diseases classified elsewhere: Secondary | ICD-10-CM | POA: Insufficient documentation

## 2022-08-15 DIAGNOSIS — Z20822 Contact with and (suspected) exposure to covid-19: Secondary | ICD-10-CM | POA: Insufficient documentation

## 2022-08-15 DIAGNOSIS — Z87891 Personal history of nicotine dependence: Secondary | ICD-10-CM | POA: Insufficient documentation

## 2022-08-15 DIAGNOSIS — R509 Fever, unspecified: Secondary | ICD-10-CM | POA: Diagnosis present

## 2022-08-15 LAB — RESP PANEL BY RT-PCR (FLU A&B, COVID) ARPGX2
Influenza A by PCR: NEGATIVE
Influenza B by PCR: NEGATIVE
SARS Coronavirus 2 by RT PCR: NEGATIVE

## 2022-08-15 NOTE — ED Provider Notes (Signed)
[facility] Provider Note: Lowella Dell, MD, FACEP  CSN: [id number] MRN: [id number] ARRIVAL: 08/15/22 at 2215 ROOM: [facility]   CHIEF COMPLAINT  Flu like symptom   HISTORY OF PRESENT ILLNESS  08/15/22 11:12 PM Austin Zuniga is a 31 y.o. male with flulike symptoms for 4 days.  Specifically he has had subjective fever, body aches, generalized weakness, chills and nasal congestion.  He had nausea without vomiting through yesterday but the nausea resolved today.  He has had diarrhea with this.  He denies cough or significant shortness of breath.  He has been taking TheraFlu without adequate relief.   Past Medical History:  Diagnosis Date   Anxiety 2017   on lorazepam 1 mg tid    Irregular heart beat 2017    Past Surgical History:  Procedure Laterality Date   none      Family History  Problem Relation Age of Onset   Birth defects Mother    Diabetes Mother    Hypertension Mother    Kidney disease Mother    Diabetes Maternal Grandfather    Hypertension Maternal Grandfather     Social History   Tobacco Use   Smoking status: Former    Packs/day: 0.50    Types: Cigarettes   Smokeless tobacco: Never  Vaping Use   Vaping Use: Every day  Substance Use Topics   Alcohol use: Yes    Comment: social   Drug use: No    Frequency: 5.0 times per week    Prior to Admission medications   Medication Sig Start Date End Date Taking? Authorizing Provider  acetaminophen (TYLENOL) 500 MG tablet Take 1 tablet (500 mg total) by mouth every 6 (six) hours as needed for mild pain. 01/02/21   Arrien, York Ram, MD  buPROPion (WELLBUTRIN SR) 100 MG 12 hr tablet TAKE 1 TABLET BY MOUTH TWICE DAILY. 03/14/16   Almon Hercules, MD  ibuprofen (ADVIL) 400 MG tablet Take 1 tablet (400 mg total) by mouth every 6 (six) hours as needed for moderate pain (as needed for moderate to severe pain.). 01/02/21   Arrien, York Ram, MD  LORazepam (ATIVAN) 1 MG tablet Take 1 tablet (1 mg  total) by mouth 3 (three) times daily as needed for anxiety. 03/13/16   Almon Hercules, MD  methocarbamol (ROBAXIN) 500 MG tablet Take 1 tablet (500 mg total) by mouth 2 (two) times daily. 03/25/22   Henderly, Britni A, PA-C  naproxen (NAPROSYN) 500 MG tablet Take 1 tablet (500 mg total) by mouth 2 (two) times daily. 03/25/22   Henderly, Britni A, PA-C  pantoprazole (PROTONIX) 40 MG tablet Take 1 tablet (40 mg total) by mouth daily. 01/02/21   Arrien, York Ram, MD    Allergies Patient has no known allergies.   REVIEW OF SYSTEMS  Negative except as noted here or in the History of Present Illness.   PHYSICAL EXAMINATION  Initial Vital Signs Blood pressure 118/83, pulse 88, temperature 98.5 F (36.9 C), temperature source Oral, resp. rate 18, height 6\' 1"  (1.854 m), weight 120.7 kg, SpO2 99 %.  Examination General: Well-developed, well-nourished male in no acute distress; appearance consistent with age of record HENT: normocephalic; atraumatic Eyes: Normal appearance Neck: supple Heart: regular rate and rhythm Lungs: clear to auscultation bilaterally Abdomen: soft; nondistended; nontender; bowel sounds present Extremities: No deformity; full range of motion Neurologic: Awake, alert and oriented; motor function intact in all extremities and symmetric; no facial droop Skin: Warm and dry Psychiatric: Normal mood  and affect   RESULTS  Summary of this visit's results, reviewed and interpreted by myself:   EKG Interpretation  Date/Time:    Ventricular Rate:    PR Interval:    QRS Duration:   QT Interval:    QTC Calculation:   R Axis:     Text Interpretation:         Laboratory Studies: Results for orders placed or performed during the hospital encounter of 08/15/22 (from the past 24 hour(s))  Resp Panel by RT-PCR (Flu A&B, Covid) Anterior Nasal Swab     Status: None   Collection Time: 08/15/22 10:44 PM   Specimen: Anterior Nasal Swab  Result Value Ref Range   SARS  Coronavirus 2 by RT PCR NEGATIVE NEGATIVE   Influenza A by PCR NEGATIVE NEGATIVE   Influenza B by PCR NEGATIVE NEGATIVE   Imaging Studies: No results found.  ED COURSE and MDM  Nursing notes, initial and subsequent vitals signs, including pulse oximetry, reviewed and interpreted by myself.  Vitals:   08/15/22 2240 08/15/22 2240  BP:  118/83  Pulse:  88  Resp:  18  Temp:  98.5 F (36.9 C)  TempSrc:  Oral  SpO2:  99%  Weight: 120.7 kg   Height: 6\' 1"  (1.854 m)    Medications - No data to display  Patient advised of negative COVID and flu test.  He states he does not need any prescriptions he just wanted to make sure he did not have either disease.  Presentation is consistent with a viral respiratory illness, possibly RSV as it is present in the community currently.  PROCEDURES  Procedures   ED DIAGNOSES     ICD-10-CM   1. Viral respiratory illness  J98.8    B97.89          Liridona Mashaw, MD 08/15/22 2341

## 2022-08-15 NOTE — ED Triage Notes (Signed)
Patient arrived via POV c/o flu like symptoms x 4 days. Patient states fever, chills, nasal congestion. Patient is AO x 4, VS WDL, normal gait.

## 2023-06-01 ENCOUNTER — Encounter (HOSPITAL_BASED_OUTPATIENT_CLINIC_OR_DEPARTMENT_OTHER): Payer: Self-pay | Admitting: Emergency Medicine

## 2023-06-01 ENCOUNTER — Other Ambulatory Visit: Payer: Self-pay

## 2023-06-01 ENCOUNTER — Emergency Department (HOSPITAL_BASED_OUTPATIENT_CLINIC_OR_DEPARTMENT_OTHER)
Admission: EM | Admit: 2023-06-01 | Discharge: 2023-06-01 | Disposition: A | Discharge status: Home / Self Care | Payer: Medicaid Other | Source: Home / Self Care | Attending: Emergency Medicine | Admitting: Emergency Medicine

## 2023-06-01 DIAGNOSIS — H6692 Otitis media, unspecified, left ear: Secondary | ICD-10-CM | POA: Diagnosis not present

## 2023-06-01 DIAGNOSIS — H66002 Acute suppurative otitis media without spontaneous rupture of ear drum, left ear: Secondary | ICD-10-CM

## 2023-06-01 DIAGNOSIS — H9202 Otalgia, left ear: Secondary | ICD-10-CM | POA: Diagnosis present

## 2023-06-01 MED ORDER — AMOXICILLIN-POT CLAVULANATE 875-125 MG PO TABS
1.0000 | ORAL_TABLET | Freq: Once | ORAL | Status: AC
Start: 2023-06-01 — End: 2023-06-01
  Administered 2023-06-01: 1 via ORAL
  Filled 2023-06-01: qty 1

## 2023-06-01 MED ORDER — IBUPROFEN 400 MG PO TABS
600.0000 mg | ORAL_TABLET | Freq: Once | ORAL | Status: AC
  Administered 2023-06-01: 600 mg via ORAL
  Filled 2023-06-01: qty 1

## 2023-06-01 MED ORDER — AMOXICILLIN-POT CLAVULANATE 875-125 MG PO TABS
1.0000 | ORAL_TABLET | Freq: Two times a day (BID) | ORAL | 0 refills | Status: AC
Start: 2023-06-01 — End: ?

## 2023-06-01 NOTE — ED Triage Notes (Signed)
Pt states that his left ear has been bothering him for about a week.  Initially he used "swimmer's ear" medication which relieved the pain but now the pain is back and OTC medications are not helping at all.  He also reports dental pain on the left side.

## 2023-06-01 NOTE — ED Provider Notes (Signed)
[place] [facility] AT [facility] Provider Note   CSN: [id number] Arrival date & time: 06/01/23  0300     History  Chief Complaint  Patient presents with   Otalgia    Austin Zuniga is a 32 y.o. male.  The history is provided by the patient.  Otalgia Austin Zuniga is a 32 y.o. male who presents to the Emergency Department complaining of earache.  He presents emergency department for evaluation of left earache for 1 week.  Over the last 2 days he describes as excruciating pain that radiates throughout his head.  Pain is sharp is and constant.  No associated fever, nausea, vomiting, difficulty breathing.  No additional symptoms.  He has no known medical problems and takes no routine medications.      Home Medications Prior to Admission medications   Medication Sig Start Date End Date Taking? Authorizing Provider  acetaminophen (TYLENOL) 500 MG tablet Take 1 tablet (500 mg total) by mouth every 6 (six) hours as needed for mild pain. 01/02/21   Arrien, York Ram, MD  buPROPion (WELLBUTRIN SR) 100 MG 12 hr tablet TAKE 1 TABLET BY MOUTH TWICE DAILY. 03/14/16   Almon Hercules, MD  ibuprofen (ADVIL) 400 MG tablet Take 1 tablet (400 mg total) by mouth every 6 (six) hours as needed for moderate pain (as needed for moderate to severe pain.). 01/02/21   Arrien, York Ram, MD  LORazepam (ATIVAN) 1 MG tablet Take 1 tablet (1 mg total) by mouth 3 (three) times daily as needed for anxiety. 03/13/16   Almon Hercules, MD  methocarbamol (ROBAXIN) 500 MG tablet Take 1 tablet (500 mg total) by mouth 2 (two) times daily. 03/25/22   Henderly, Britni A, PA-C  naproxen (NAPROSYN) 500 MG tablet Take 1 tablet (500 mg total) by mouth 2 (two) times daily. 03/25/22   Henderly, Britni A, PA-C  pantoprazole (PROTONIX) 40 MG tablet Take 1 tablet (40 mg total) by mouth daily. 01/02/21   Arrien, York Ram, MD      Allergies    Patient has no known allergies.    Review of Systems    Review of Systems  HENT:  Positive for ear pain.   All other systems reviewed and are negative.   Physical Exam Updated Vital Signs BP 136/84 (BP Location: Right Arm)   Pulse 92   Temp 98.1 F (36.7 C) (Oral)   Resp 16   Ht 6\' 1"  (1.854 m)   Wt 122.5 kg   SpO2 97%   BMI 35.62 kg/m  Physical Exam Vitals and nursing note reviewed.  Constitutional:      Appearance: He is well-developed.  HENT:     Head: Normocephalic.     Comments: Left TM with erythema and purulent effusion.  Right TM is within normal limits.  Tonsils are enlarged but not erythematous.  No exudate. Cardiovascular:     Rate and Rhythm: Normal rate and regular rhythm.     Heart sounds: No murmur heard. Pulmonary:     Effort: Pulmonary effort is normal. No respiratory distress.     Breath sounds: Normal breath sounds.  Musculoskeletal:        General: No tenderness.  Skin:    General: Skin is warm and dry.  Neurological:     Mental Status: He is alert and oriented to person, place, and time.  Psychiatric:        Behavior: Behavior normal.     ED Results / Procedures / Treatments  Labs (all labs ordered are listed, but only abnormal results are displayed) Labs Reviewed - No data to display  EKG None  Radiology No results found.  Procedures Procedures    Medications Ordered in ED Medications - No data to display  ED Course/ Medical Decision Making/ A&P                                 Medical Decision Making Risk Prescription drug management.   Patient here for evaluation of left-sided earache.  He does have acute otitis media on examination, no complicating features.  Discussed with patient home care with OTC analgesics.  Will prescribe Augmentin.  Discussed outpatient follow-up and return precautions.        Final Clinical Impression(s) / ED Diagnoses Final diagnoses:  None    Rx / DC Orders ED Discharge Orders     None         Tilden Fossa, MD 06/01/23  [phone]
